# Patient Record
Sex: Male | Born: 1945 | Race: White | Hispanic: No | Smoking: Former smoker
Health system: Southern US, Community
[De-identification: ages and names within clinical notes are randomized; demographics above are authoritative.]

## PROBLEM LIST (undated history)

## (undated) DIAGNOSIS — E785 Hyperlipidemia, unspecified: Secondary | ICD-10-CM

## (undated) DIAGNOSIS — C801 Malignant (primary) neoplasm, unspecified: Secondary | ICD-10-CM

## (undated) DIAGNOSIS — I2510 Atherosclerotic heart disease of native coronary artery without angina pectoris: Secondary | ICD-10-CM

## (undated) DIAGNOSIS — M1990 Unspecified osteoarthritis, unspecified site: Secondary | ICD-10-CM

---

## 2015-08-05 HISTORY — PX: OTHER SURGICAL HISTORY: SHX169

## 2017-03-16 DIAGNOSIS — E7801 Familial hypercholesterolemia: Secondary | ICD-10-CM | POA: Diagnosis not present

## 2017-03-16 DIAGNOSIS — R001 Bradycardia, unspecified: Secondary | ICD-10-CM | POA: Diagnosis not present

## 2017-03-16 DIAGNOSIS — I200 Unstable angina: Secondary | ICD-10-CM | POA: Diagnosis not present

## 2017-03-16 DIAGNOSIS — I208 Other forms of angina pectoris: Secondary | ICD-10-CM | POA: Insufficient documentation

## 2017-03-16 DIAGNOSIS — C9110 Chronic lymphocytic leukemia of B-cell type not having achieved remission: Secondary | ICD-10-CM | POA: Insufficient documentation

## 2017-03-16 DIAGNOSIS — I444 Left anterior fascicular block: Secondary | ICD-10-CM | POA: Diagnosis not present

## 2017-03-16 DIAGNOSIS — I2089 Other forms of angina pectoris: Secondary | ICD-10-CM | POA: Insufficient documentation

## 2017-03-16 DIAGNOSIS — C9190 Lymphoid leukemia, unspecified not having achieved remission: Secondary | ICD-10-CM | POA: Diagnosis not present

## 2017-03-17 DIAGNOSIS — I2510 Atherosclerotic heart disease of native coronary artery without angina pectoris: Secondary | ICD-10-CM | POA: Insufficient documentation

## 2017-03-19 HISTORY — PX: CORONARY ARTERY BYPASS GRAFT: SHX141

## 2017-04-20 ENCOUNTER — Telehealth (HOSPITAL_COMMUNITY): Payer: Self-pay

## 2017-04-20 NOTE — Telephone Encounter (Signed)
I called and left message on voicemail to call office about scheduling for cardiac rehab. I left office contact information on patient voicemail to return call.  ° °

## 2017-04-23 ENCOUNTER — Telehealth (HOSPITAL_COMMUNITY): Payer: Self-pay

## 2017-04-23 NOTE — Telephone Encounter (Signed)
I returned patient phone call to discuss cardiac rehab. Patient is currently out and unable to schedule with me. Patient stated when he gets home, he will call me back to schedule.

## 2017-04-27 ENCOUNTER — Telehealth (HOSPITAL_COMMUNITY): Payer: Self-pay

## 2017-04-27 NOTE — Telephone Encounter (Signed)
Cardiac Rehab Medication Review by a Pharmacist  Does the patient  feel that his/her medications are working for him/her?  yes  Has the patient been experiencing any side effects to the medications prescribed?  no  Does the patient measure his/her own blood pressure or blood glucose at home?  Yes; SBP ranges from 114-142 and DBP 72-88; HR 55-66  Does the patient have any problems obtaining medications due to transportation or finances?   no  Understanding of regimen: good Understanding of indications: good Potential of compliance: good   Pharmacist comments: Marc Stewart is a pleasant 71 y/o M who I spoke with regarding his medication history prior to an upcoming cardiac rehab appointment. He states no concerns with his medications and has been feeling relatively well. He monitors his blood pressure/heart rate very closely and takes the metoprolol only once daily because he feels it is well maintained and might dip to low.   [name], PharmD PGY1 Acute Care Pharmacy Resident 04/27/2017 7:20 PM

## 2017-04-30 ENCOUNTER — Encounter (HOSPITAL_COMMUNITY)
Admission: RE | Admit: 2017-04-30 | Discharge: 2017-04-30 | Disposition: A | Discharge status: Home / Self Care | Payer: No Typology Code available for payment source | Source: Ambulatory Visit | Attending: Cardiology | Admitting: Cardiology

## 2017-04-30 ENCOUNTER — Encounter (HOSPITAL_COMMUNITY): Payer: Self-pay

## 2017-04-30 VITALS — BP 112/62 | HR 58 | Ht 70.25 in | Wt 198.6 lb

## 2017-04-30 DIAGNOSIS — E785 Hyperlipidemia, unspecified: Secondary | ICD-10-CM | POA: Diagnosis not present

## 2017-04-30 DIAGNOSIS — Z951 Presence of aortocoronary bypass graft: Secondary | ICD-10-CM | POA: Diagnosis present

## 2017-04-30 DIAGNOSIS — I2510 Atherosclerotic heart disease of native coronary artery without angina pectoris: Secondary | ICD-10-CM | POA: Diagnosis not present

## 2017-04-30 DIAGNOSIS — Z87891 Personal history of nicotine dependence: Secondary | ICD-10-CM | POA: Insufficient documentation

## 2017-04-30 DIAGNOSIS — C9110 Chronic lymphocytic leukemia of B-cell type not having achieved remission: Secondary | ICD-10-CM | POA: Insufficient documentation

## 2017-04-30 HISTORY — DX: Hyperlipidemia, unspecified: E78.5

## 2017-04-30 HISTORY — DX: Malignant (primary) neoplasm, unspecified: C80.1

## 2017-04-30 HISTORY — DX: Atherosclerotic heart disease of native coronary artery without angina pectoris: I25.10

## 2017-04-30 NOTE — Progress Notes (Signed)
Marc Stewart 71 y.o. male DOB: 12/28/1945 MRN: [id number]      Nutrition Note  1. S/P CABG x 4    Meds reviewed. HT: Ht Readings from Last 3 Encounters:  04/30/17 5' 10.25" (1.784 m)   WT: Wt Readings from Last 3 Encounters:  04/30/17 198 lb 10.2 oz (90.1 kg)   BMI 28.3   Current tobacco use? Unknown at this time Labs:  Lipid Panel  04/03/17 Total Chol 108 Trig 205 HDL 24 LDL 43  03/17/17 Hemoglobin A1c 5.4   CBG (last 3)  No results for input(s): GLUCAP in the last 72 hours.  Nutrition Note Spoke with pt. Nutrition plan and goals reviewed with pt. Pt is following Step 1 of the Therapeutic Lifestyle Changes diet. Pt lives alone and finds that cooking for 1 person is difficult at times. Pt expressed understanding of the information reviewed. Pt aware of nutrition education classes offered .  Nutrition Diagnosis ? Food-and nutrition-related knowledge deficit related to lack of exposure to information as related to diagnosis of: ? CVD  ? Overweight related to excessive energy intake as evidenced by a BMI of 28.3  Nutrition Intervention ? Pt's individual nutrition plan and goals reviewed with pt. ? Pt given handouts for: ? Nutrition I class ? Nutrition II class   Nutrition Goal(s):  ? Pt to identify and limit food sources of saturated fat, trans fat, and sodium  Plan:  Pt to attend nutrition classes ? Nutrition I ? Nutrition II ? Portion Distortion  Will provide client-centered nutrition education as part of interdisciplinary care.   Monitor and evaluate progress toward nutrition goal with team.  Derek Mound, M.Ed, RD, LDN, CDE 04/30/2017 12:23 PM
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
## 2017-05-01 DIAGNOSIS — N400 Enlarged prostate without lower urinary tract symptoms: Secondary | ICD-10-CM | POA: Diagnosis not present

## 2017-05-01 DIAGNOSIS — E784 Other hyperlipidemia: Secondary | ICD-10-CM | POA: Diagnosis not present

## 2017-05-01 DIAGNOSIS — E611 Iron deficiency: Secondary | ICD-10-CM | POA: Diagnosis not present

## 2017-05-01 DIAGNOSIS — F339 Major depressive disorder, recurrent, unspecified: Secondary | ICD-10-CM | POA: Diagnosis not present

## 2017-05-01 DIAGNOSIS — Z131 Encounter for screening for diabetes mellitus: Secondary | ICD-10-CM | POA: Diagnosis not present

## 2017-05-01 DIAGNOSIS — I25811 Atherosclerosis of native coronary artery of transplanted heart without angina pectoris: Secondary | ICD-10-CM | POA: Diagnosis not present

## 2017-05-01 DIAGNOSIS — Z5181 Encounter for therapeutic drug level monitoring: Secondary | ICD-10-CM | POA: Diagnosis not present

## 2017-05-01 DIAGNOSIS — M1711 Unilateral primary osteoarthritis, right knee: Secondary | ICD-10-CM | POA: Diagnosis not present

## 2017-05-01 DIAGNOSIS — L508 Other urticaria: Secondary | ICD-10-CM | POA: Diagnosis not present

## 2017-05-01 DIAGNOSIS — I959 Hypotension, unspecified: Secondary | ICD-10-CM | POA: Diagnosis not present

## 2017-05-06 ENCOUNTER — Encounter (HOSPITAL_COMMUNITY)
Admission: RE | Admit: 2017-05-06 | Discharge: 2017-05-06 | Disposition: A | Discharge status: Home / Self Care | Payer: No Typology Code available for payment source | Source: Ambulatory Visit | Attending: Cardiology | Admitting: Cardiology

## 2017-05-06 DIAGNOSIS — C9110 Chronic lymphocytic leukemia of B-cell type not having achieved remission: Secondary | ICD-10-CM | POA: Insufficient documentation

## 2017-05-06 DIAGNOSIS — Z951 Presence of aortocoronary bypass graft: Secondary | ICD-10-CM | POA: Diagnosis present

## 2017-05-06 DIAGNOSIS — E785 Hyperlipidemia, unspecified: Secondary | ICD-10-CM | POA: Insufficient documentation

## 2017-05-06 DIAGNOSIS — Z87891 Personal history of nicotine dependence: Secondary | ICD-10-CM | POA: Insufficient documentation

## 2017-05-06 DIAGNOSIS — I2510 Atherosclerotic heart disease of native coronary artery without angina pectoris: Secondary | ICD-10-CM | POA: Diagnosis not present

## 2017-05-06 NOTE — Progress Notes (Signed)
Daily Session Note  Patient Details  Name: Marc Stewart MRN: [id number] Date of Birth: 28-Jun-1946 Referring Provider:     CARDIAC REHAB PHASE II ORIENTATION from 04/30/2017 in [place]  Referring Provider  Bayard Males MD [Dr Awanda [facility], Dr Fransico Him ]      Encounter Date: 05/06/2017  Check In:     Session Check In - 05/06/17 1052      Check-In   Location [facility]   Staff Present Barnet Pall, RN, BSN;Amber Fair, MS, ACSM RCEP, Exercise Physiologist;Carlette Wilber Oliphant, RN, Deland Pretty, MS, ACSM CEP, Exercise Physiologist   Supervising physician immediately available to respond to emergencies [facility] immediately available   Physician(s) Dr Eliseo Squires   Medication changes reported     No   Fall or balance concerns reported    No   Tobacco Cessation No Change   Warm-up and Cool-down Performed as group-led instruction   Resistance Training Performed No   VAD Patient? No     Pain Assessment   Currently in Pain? No/denies      Capillary Blood Glucose: No results found for this or any previous visit (from the past 24 hour(s)).    History  Smoking Status  . Former Smoker  . Years: 50.00  . Types: Cigarettes  . Quit date: 1998  Smokeless Tobacco  . Never Used    Comment: patient said he smoked 1pack per week    Goals Met:  Exercise tolerated well  Goals Unmet:  Not Applicable  Comments: [name] started cardiac rehab today.  Pt tolerated light exercise without difficulty. VSS, telemetry-Sinus Rhythm with  An inverted T wave, asymptomatic.  Medication list reconciled. Pt denies barriers to medicaiton compliance.  PSYCHOSOCIAL ASSESSMENT:  PHQ-0. Pt exhibits positive coping skills, hopeful outlook with supportive family. No psychosocial needs identified at this time, no psychosocial interventions necessary.    Pt enjoys gardening, fishing,watching sports and going to the movies.   Pt  oriented to exercise equipment and routine.    Understanding verbalized.Barnet Pall, RN,BSN 05/06/2017 12:34 PM   Dr. Fransico Him is Medical Director for Cardiac Rehab at [facility].

## 2017-05-08 ENCOUNTER — Encounter (HOSPITAL_COMMUNITY)
Admission: RE | Admit: 2017-05-08 | Discharge: 2017-05-08 | Disposition: A | Discharge status: Home / Self Care | Payer: No Typology Code available for payment source | Source: Ambulatory Visit | Attending: Cardiology | Admitting: Cardiology

## 2017-05-08 DIAGNOSIS — Z951 Presence of aortocoronary bypass graft: Secondary | ICD-10-CM

## 2017-05-11 ENCOUNTER — Encounter (HOSPITAL_COMMUNITY)
Admission: RE | Admit: 2017-05-11 | Discharge: 2017-05-11 | Disposition: A | Discharge status: Home / Self Care | Payer: No Typology Code available for payment source | Source: Ambulatory Visit | Attending: Cardiology | Admitting: Cardiology

## 2017-05-11 DIAGNOSIS — Z951 Presence of aortocoronary bypass graft: Secondary | ICD-10-CM | POA: Diagnosis not present

## 2017-05-13 ENCOUNTER — Encounter (HOSPITAL_COMMUNITY)
Admission: RE | Admit: 2017-05-13 | Discharge: 2017-05-13 | Disposition: A | Discharge status: Home / Self Care | Payer: No Typology Code available for payment source | Source: Ambulatory Visit | Attending: Cardiology | Admitting: Cardiology

## 2017-05-13 DIAGNOSIS — Z951 Presence of aortocoronary bypass graft: Secondary | ICD-10-CM
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
## 2017-05-14 DIAGNOSIS — F339 Major depressive disorder, recurrent, unspecified: Secondary | ICD-10-CM | POA: Diagnosis not present

## 2017-05-14 DIAGNOSIS — E611 Iron deficiency: Secondary | ICD-10-CM | POA: Diagnosis not present

## 2017-05-14 DIAGNOSIS — I959 Hypotension, unspecified: Secondary | ICD-10-CM | POA: Diagnosis not present

## 2017-05-14 DIAGNOSIS — M1711 Unilateral primary osteoarthritis, right knee: Secondary | ICD-10-CM | POA: Diagnosis not present

## 2017-05-14 DIAGNOSIS — I25811 Atherosclerosis of native coronary artery of transplanted heart without angina pectoris: Secondary | ICD-10-CM | POA: Diagnosis not present

## 2017-05-14 DIAGNOSIS — N400 Enlarged prostate without lower urinary tract symptoms: Secondary | ICD-10-CM | POA: Diagnosis not present

## 2017-05-14 DIAGNOSIS — E785 Hyperlipidemia, unspecified: Secondary | ICD-10-CM | POA: Diagnosis not present

## 2017-05-15 ENCOUNTER — Encounter (HOSPITAL_COMMUNITY)
Admission: RE | Admit: 2017-05-15 | Discharge: 2017-05-15 | Disposition: A | Discharge status: Home / Self Care | Payer: No Typology Code available for payment source | Source: Ambulatory Visit | Attending: Cardiology | Admitting: Cardiology

## 2017-05-15 DIAGNOSIS — Z951 Presence of aortocoronary bypass graft: Secondary | ICD-10-CM

## 2017-05-18 ENCOUNTER — Encounter (HOSPITAL_COMMUNITY)
Admission: RE | Admit: 2017-05-18 | Discharge: 2017-05-18 | Disposition: A | Discharge status: Home / Self Care | Payer: No Typology Code available for payment source | Source: Ambulatory Visit | Attending: Cardiology | Admitting: Cardiology

## 2017-05-18 DIAGNOSIS — Z951 Presence of aortocoronary bypass graft: Secondary | ICD-10-CM | POA: Diagnosis not present

## 2017-05-20 ENCOUNTER — Encounter (HOSPITAL_COMMUNITY)
Admission: RE | Admit: 2017-05-20 | Discharge: 2017-05-20 | Disposition: A | Discharge status: Home / Self Care | Payer: No Typology Code available for payment source | Source: Ambulatory Visit | Attending: Cardiology | Admitting: Cardiology

## 2017-05-20 DIAGNOSIS — Z951 Presence of aortocoronary bypass graft: Secondary | ICD-10-CM | POA: Diagnosis not present

## 2017-05-22 ENCOUNTER — Encounter (HOSPITAL_COMMUNITY): Payer: No Typology Code available for payment source

## 2017-05-25 ENCOUNTER — Encounter (HOSPITAL_COMMUNITY): Payer: No Typology Code available for payment source

## 2017-05-25 ENCOUNTER — Telehealth (HOSPITAL_COMMUNITY): Payer: Self-pay

## 2017-05-27 ENCOUNTER — Encounter (HOSPITAL_COMMUNITY): Payer: No Typology Code available for payment source

## 2017-05-29 ENCOUNTER — Encounter (HOSPITAL_COMMUNITY): Payer: No Typology Code available for payment source

## 2017-06-01 ENCOUNTER — Encounter (HOSPITAL_COMMUNITY): Payer: No Typology Code available for payment source

## 2017-06-01 ENCOUNTER — Telehealth (HOSPITAL_COMMUNITY): Payer: Self-pay | Admitting: *Deleted

## 2017-06-03 ENCOUNTER — Encounter (HOSPITAL_COMMUNITY)
Admission: RE | Admit: 2017-06-03 | Discharge: 2017-06-03 | Disposition: A | Discharge status: Home / Self Care | Payer: No Typology Code available for payment source | Source: Ambulatory Visit | Attending: Cardiology | Admitting: Cardiology

## 2017-06-03 DIAGNOSIS — Z951 Presence of aortocoronary bypass graft: Secondary | ICD-10-CM | POA: Diagnosis not present

## 2017-06-05 ENCOUNTER — Encounter (HOSPITAL_COMMUNITY): Payer: No Typology Code available for payment source

## 2017-06-05 NOTE — Progress Notes (Signed)
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 and with personalized nutrition plan. Value goals: LDL < 70mg , HDL > 40 mg.      Core Components/Risk Factors/Patient Goals Review:      Goals and Risk Factor Review    Row Name 06/05/17 1428             Core Components/Risk Factors/Patient Goals Review   Personal Goals Review Weight Management/Obesity;Lipids;Hypertension       Review Roczen is doing well with exercise at cardiac rehab       Expected Outcomes Ren will continue to participate  in phase 2 cardiac rehab and take his medicaitons as presribed          Core Components/Risk Factors/Patient Goals at Discharge (Final Review):      Goals and Risk Factor Review - 06/05/17 1428      Core Components/Risk Factors/Patient Goals Review   Personal Goals Review Weight Management/Obesity;Lipids;Hypertension   Review Bodee is doing well with exercise at cardiac rehab   Expected Outcomes Rosa will continue to participate in phase 2 cardiac rehab and take his medicaitons as presribed      ITP Comments:     ITP Comments    Row Name 04/30/17 0827           ITP Comments Dr. Fransico Him, Medical Director          Comments: Damarion is making expected progress toward personal goals after completing 34 sessions. Recommend continued exercise and life style modification education including  stress management and relaxation techniques to decrease cardiac risk profile. Jermain is doing well with exercise at cardiac rehab. Carey has been absent the past two weeks for vacation.Barnet Pall, RN,BSN 06/05/2017 5:00 PM

## 2017-06-08 ENCOUNTER — Encounter (HOSPITAL_COMMUNITY)
Admission: RE | Admit: 2017-06-08 | Discharge: 2017-06-08 | Disposition: A | Discharge status: Home / Self Care | Payer: No Typology Code available for payment source | Source: Ambulatory Visit | Attending: Cardiology | Admitting: Cardiology

## 2017-06-08 DIAGNOSIS — Z87891 Personal history of nicotine dependence: Secondary | ICD-10-CM | POA: Insufficient documentation

## 2017-06-08 DIAGNOSIS — Z951 Presence of aortocoronary bypass graft: Secondary | ICD-10-CM

## 2017-06-08 DIAGNOSIS — C9110 Chronic lymphocytic leukemia of B-cell type not having achieved remission: Secondary | ICD-10-CM | POA: Diagnosis not present

## 2017-06-08 DIAGNOSIS — I2510 Atherosclerotic heart disease of native coronary artery without angina pectoris: Secondary | ICD-10-CM | POA: Diagnosis not present

## 2017-06-08 DIAGNOSIS — E785 Hyperlipidemia, unspecified: Secondary | ICD-10-CM | POA: Insufficient documentation

## 2017-06-08 NOTE — Progress Notes (Signed)
Marc Stewart 71 y.o. male DOB: 01-09-1946 MRN: [id number]      Nutrition Note  1. 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 Nutrition plan and survey reviewed with pt. Pt is following Step 1 of the Therapeutic Lifestyle Changes diet. Per discussion, pt is doing better cooking for himself. Pt has decreased the amount of red meat consumed and is cooking more fish. Pt expressed understanding of the information reviewed. Pt aware of nutrition education classes offered .  Nutrition Diagnosis ? Food-and nutrition-related knowledge deficit related to lack of exposure to information as related to diagnosis of: ? CVD  ? Overweight related to excessive energy intake as evidenced by a BMI of 28.3  Nutrition Intervention ? Pt's individual nutrition plan and goals reviewed with pt. ? Benefits of adopting Therapeutic Lifestyle Changes discussed when Medficts reviewed.    Nutrition Goal(s):  ? Pt to identify and limit food sources of saturated fat, trans fat, and sodium  Plan:  Pt to attend nutrition classes ? Portion Distortion - met 05/20/17 Will provide client-centered nutrition education as part of interdisciplinary care.   Monitor and evaluate progress toward nutrition goal with team.  Derek Mound, M.Ed, RD, LDN, CDE 06/08/2017 11:26 AM

## 2017-06-10 ENCOUNTER — Encounter (HOSPITAL_COMMUNITY)
Admission: RE | Admit: 2017-06-10 | Discharge: 2017-06-10 | Disposition: A | Discharge status: Home / Self Care | Payer: No Typology Code available for payment source | Source: Ambulatory Visit | Attending: Cardiology | Admitting: Cardiology

## 2017-06-10 DIAGNOSIS — Z951 Presence of aortocoronary bypass graft: Secondary | ICD-10-CM | POA: Diagnosis not present

## 2017-06-12 ENCOUNTER — Encounter (HOSPITAL_COMMUNITY)
Admission: RE | Admit: 2017-06-12 | Discharge: 2017-06-12 | Disposition: A | Discharge status: Home / Self Care | Payer: No Typology Code available for payment source | Source: Ambulatory Visit | Attending: Cardiology | Admitting: Cardiology

## 2017-06-12 DIAGNOSIS — Z951 Presence of aortocoronary bypass graft: Secondary | ICD-10-CM | POA: Diagnosis not present

## 2017-06-15 ENCOUNTER — Encounter (HOSPITAL_COMMUNITY)
Admission: RE | Admit: 2017-06-15 | Discharge: 2017-06-15 | Disposition: A | Discharge status: Home / Self Care | Payer: No Typology Code available for payment source | Source: Ambulatory Visit | Attending: Cardiology | Admitting: Cardiology

## 2017-06-15 DIAGNOSIS — Z951 Presence of aortocoronary bypass graft: Secondary | ICD-10-CM

## 2017-06-17 ENCOUNTER — Encounter (HOSPITAL_COMMUNITY): Payer: No Typology Code available for payment source

## 2017-06-19 ENCOUNTER — Encounter (HOSPITAL_COMMUNITY)
Admission: RE | Admit: 2017-06-19 | Discharge: 2017-06-19 | Disposition: A | Discharge status: Home / Self Care | Payer: No Typology Code available for payment source | Source: Ambulatory Visit | Attending: Cardiology | Admitting: Cardiology

## 2017-06-19 DIAGNOSIS — Z951 Presence of aortocoronary bypass graft: Secondary | ICD-10-CM

## 2017-06-22 ENCOUNTER — Encounter (HOSPITAL_COMMUNITY)
Admission: RE | Admit: 2017-06-22 | Discharge: 2017-06-22 | Disposition: A | Discharge status: Home / Self Care | Payer: No Typology Code available for payment source | Source: Ambulatory Visit | Attending: Cardiology | Admitting: Cardiology

## 2017-06-22 DIAGNOSIS — Z951 Presence of aortocoronary bypass graft: Secondary | ICD-10-CM | POA: Diagnosis not present

## 2017-06-24 ENCOUNTER — Encounter (HOSPITAL_COMMUNITY)
Admission: RE | Admit: 2017-06-24 | Discharge: 2017-06-24 | Disposition: A | Discharge status: Home / Self Care | Payer: No Typology Code available for payment source | Source: Ambulatory Visit | Attending: Cardiology | Admitting: Cardiology

## 2017-06-24 DIAGNOSIS — Z951 Presence of aortocoronary bypass graft: Secondary | ICD-10-CM

## 2017-06-29 ENCOUNTER — Telehealth (HOSPITAL_COMMUNITY): Payer: Self-pay | Admitting: *Deleted

## 2017-06-29 ENCOUNTER — Encounter (HOSPITAL_COMMUNITY): Payer: No Typology Code available for payment source

## 2017-07-01 ENCOUNTER — Telehealth (HOSPITAL_COMMUNITY): Payer: Self-pay | Admitting: *Deleted

## 2017-07-01 ENCOUNTER — Encounter (HOSPITAL_COMMUNITY): Payer: No Typology Code available for payment source

## 2017-07-03 ENCOUNTER — Encounter (HOSPITAL_COMMUNITY): Payer: Self-pay | Admitting: *Deleted

## 2017-07-03 ENCOUNTER — Encounter (HOSPITAL_COMMUNITY): Payer: No Typology Code available for payment source

## 2017-07-03 DIAGNOSIS — Z951 Presence of aortocoronary bypass graft: Secondary | ICD-10-CM

## 2017-07-03 NOTE — Progress Notes (Signed)
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  acetaminophen (TYLENOL) 325 MG tablet, Take 325 mg by mouth every 6 (six) hours as needed., Disp: , Rfl:  .  aspirin 325 MG tablet, Take 325 mg by mouth daily., Disp: , Rfl:  .  atorvastatin (LIPITOR) 20 MG tablet, Take 20 mg by mouth daily at 6 PM., Disp: , Rfl:  .  ferrous sulfate 325 (65 FE) MG EC tablet, Take 325 mg by mouth daily with breakfast., Disp: , Rfl:  .  finasteride (PROSCAR) 5 MG tablet, Take 5 mg by mouth daily., Disp: , Rfl:  .  FLUoxetine (PROZAC) 40 MG capsule, Take 40 mg by mouth daily. Takes two in the morning, Disp: , Rfl:  .  metoprolol tartrate (LOPRESSOR) 25 MG tablet, Take 25 mg by mouth 2 (two) times daily., Disp: , Rfl:  .  naproxen (NAPROSYN) 250 MG tablet, Take 250 mg by mouth daily as needed., Disp: , Rfl:  .  nitroGLYCERIN (NITROSTAT) 0.4 MG SL tablet, Place 0.4 mg under the tongue every 5 (five) minutes as needed for chest pain., Disp: , Rfl:  .  senna (SENOKOT) 8.6 MG TABS tablet, Take 1 tablet by mouth daily as needed for mild constipation., Disp: , Rfl:  .  tamsulosin (FLOMAX) 0.4 MG CAPS capsule, Take 0.4 mg by mouth daily., Disp: , Rfl:   Past Medical History: Past Medical History:  Diagnosis Date  . Cancer (HCC)    Chronic  Lymphocytic Leukemia  . Coronary artery disease   . Hyperlipidemia     Tobacco Use: Social History   Tobacco Use  Smoking Status Former Smoker  . Years: 50.00  . Types: Cigarettes  . Last attempt to quit: 1998  . Years since quitting: 20.9  Smokeless Tobacco Never Used  Tobacco Comment   patient said he smoked 1pack per week    Labs: Recent Review Flowsheet Data    There is no flowsheet data to display.      Capillary Blood Glucose: No results found for: GLUCAP   Exercise Target Goals:    Exercise Program Goal: Individual exercise prescription set with THRR, safety & activity barriers. Participant demonstrates ability to understand and report RPE using BORG scale, to self-measure pulse accurately, and to acknowledge the importance of the exercise prescription.  Exercise Prescription Goal: Starting with aerobic activity 30 plus minutes a day, 3 days per week for initial exercise prescription. Provide home exercise prescription and guidelines that participant acknowledges understanding prior to discharge.  Activity Barriers & Risk Stratification:   6 Minute Walk:   Oxygen Initial Assessment:   Oxygen Re-Evaluation:   Oxygen Discharge (Final Oxygen Re-Evaluation):   Initial Exercise Prescription:   Perform Capillary Blood Glucose checks as needed.  Exercise Prescription Changes: Exercise Prescription Changes  Row Name 05/11/17 1500 06/03/17 1100 06/22/17 0949         Response to Exercise   Blood Pressure (Admit)  110/64  104/60  110/70     Blood Pressure (Exercise)  124/70  118/78  128/74     Blood Pressure (Exit)  100/60  122/78  122/82     Heart Rate (Admit)  59 bpm  59 bpm  87 bpm     Heart Rate (Exercise)  81 bpm  87 bpm  102 bpm     Heart Rate (Exit)  56 bpm  58 bpm  87 bpm     Rating of Perceived Exertion (Exercise)  13  13  12      Symptoms  none  none  none     Duration  Progress to 30 minutes of  aerobic without signs/symptoms of  physical distress  Progress to 30 minutes of  aerobic without signs/symptoms of physical distress  Progress to 30 minutes of  aerobic without signs/symptoms of physical distress     Intensity  THRR unchanged  THRR unchanged  THRR unchanged       Progression   Progression  Continue to progress workloads to maintain intensity without signs/symptoms of physical distress.  Continue to progress workloads to maintain intensity without signs/symptoms of physical distress.  Continue to progress workloads to maintain intensity without signs/symptoms of physical distress.     Average METs  2.8  3.9  4.8       Resistance Training   Training Prescription  Yes  No  Yes     Weight  3lbs  -  5lbs     Reps  10-15  -  10-15     Time  10 Minutes  -  10 Minutes       Interval Training   Interval Training  No  No  No       Treadmill   MPH  2.7  2.8  2.8     Grade  1  3  3      Minutes  10  10  10      METs  3.44  3.91  4.3       Bike   Level  4 upright scifit  5 upright scifit  5 upright scifit     Minutes  10  10  10      METs  -  4.1  4       NuStep   Level  2  5  5      SPM  80  90  90     Minutes  10  10  10      METs  2.1  3.6  6.1       Home Exercise Plan   Plans to continue exercise at  -  Home (comment)  Home (comment)     Frequency  -  Add 3 additional days to program exercise sessions.  Add 3 additional days to program exercise sessions.     Initial Home Exercises Provided  -  05/18/17  05/18/17        Exercise Comments: Exercise Comments    Row Name 05/13/17 1106 06/03/17 0947 06/22/17 0949       Exercise Comments  Reviewed METs and goals with patient.  Reviewed METs and goals with patient.  Reviewed goals with patient.        Exercise Goals and Review:   Exercise Goals Re-Evaluation : Exercise Goals Re-Evaluation    Row Name  05/13/17 1106 06/03/17 0947 06/22/17 0949         Exercise Goal Re-Evaluation   Exercise Goals Review  Increase Physical Activity  Increase Physical  Activity  Increase Physical Activity     Comments  Patient is walking 15 minutes, 3 days/week.  Patient is still walking 15 minutes, 3 days/week. Encouraged to gradually  increase duration to 30 minutes, 3 days/week, and pt is agreeable to this.  Making good progress with exercise at cardiac rehab. Pt states he's walking 15 minutes daily at home for his exercise.     Expected Outcomes  Increase exercise duration to 30 minutes, 3 days/week.  Increase exercise duration to 30 minutes, 3 days/week in addition to exercise at CR.  Increase exercise from 15 minutes to 30 minutes in addition to exercise at CR.         Discharge Exercise Prescription (Final Exercise Prescription Changes): Exercise Prescription Changes - 06/22/17 0949      Response to Exercise   Blood Pressure (Admit)  110/70    Blood Pressure (Exercise)  128/74    Blood Pressure (Exit)  122/82    Heart Rate (Admit)  87 bpm    Heart Rate (Exercise)  102 bpm    Heart Rate (Exit)  87 bpm    Rating of Perceived Exertion (Exercise)  12    Symptoms  none    Duration  Progress to 30 minutes of  aerobic without signs/symptoms of physical distress    Intensity  THRR unchanged      Progression   Progression  Continue to progress workloads to maintain intensity without signs/symptoms of physical distress.    Average METs  4.8      Resistance Training   Training Prescription  Yes    Weight  5lbs    Reps  10-15    Time  10 Minutes      Interval Training   Interval Training  No      Treadmill   MPH  2.8    Grade  3    Minutes  10    METs  4.3      Bike   Level  5 upright scifit    Minutes  10    METs  4      NuStep   Level  5    SPM  90    Minutes  10    METs  6.1      Home Exercise Plan   Plans to continue exercise at  Home (comment)    Frequency  Add 3 additional days to program exercise sessions.    Initial Home Exercises Provided  05/18/17       Nutrition:  Target Goals: Understanding of nutrition guidelines,  daily intake of sodium 1500mg , cholesterol 200mg , calories 30% from fat and 7% or less from saturated fats, daily to have 5 or more servings of fruits and vegetables.  Biometrics:    Nutrition Therapy Plan and Nutrition Goals:   Nutrition Discharge: Nutrition Scores:   Nutrition Goals Re-Evaluation:   Nutrition Goals Re-Evaluation:   Nutrition Goals Discharge (Final Nutrition Goals Re-Evaluation):   Psychosocial: Target Goals: Acknowledge presence or absence of significant depression and/or stress, maximize coping skills, provide positive support system. Participant is able to verbalize types and ability to use techniques and skills needed for reducing stress and depression.  Initial Review & Psychosocial Screening:   Quality of Life Scores:   PHQ-9: Recent Review Flowsheet Data    Depression screen  PHQ 2/9 05/06/2017   Decreased Interest 0   Down, Depressed, Hopeless 0   PHQ - 2 Score 0     Interpretation of Total Score  Total Score Depression Severity:  1-4 = Minimal depression, 5-9 = Mild depression, 10-14 = Moderate depression, 15-19 = Moderately severe depression, 20-27 = Severe depression   Psychosocial Evaluation and Intervention:   Psychosocial Re-Evaluation: Psychosocial Re-Evaluation    Row Name 06/05/17 1430 07/03/17 1724           Psychosocial Re-Evaluation   Current issues with  None Identified  None Identified      Interventions  Encouraged to attend Cardiac Rehabilitation for the exercise  Encouraged to attend Cardiac Rehabilitation for the exercise      Continue Psychosocial Services   No Follow up required  No Follow up required         Psychosocial Discharge (Final Psychosocial Re-Evaluation): Psychosocial Re-Evaluation - 07/03/17 1724      Psychosocial Re-Evaluation   Current issues with  None Identified    Interventions  Encouraged to attend Cardiac Rehabilitation for the exercise    Continue Psychosocial Services   No Follow up  required       Vocational Rehabilitation: Provide vocational rehab assistance to qualifying candidates.   Vocational Rehab Evaluation & Intervention:   Education: Education Goals: Education classes will be provided on a weekly basis, covering required topics. Participant will state understanding/return demonstration of topics presented.  Learning Barriers/Preferences:   Education Topics: Count Your Pulse:  -Group instruction provided by verbal instruction, demonstration, patient participation and written materials to support subject.  Instructors address importance of being able to find your pulse and how to count your pulse when at home without a heart monitor.  Patients get hands on experience counting their pulse with staff help and individually.   Heart Attack, Angina, and Risk Factor Modification:  -Group instruction provided by verbal instruction, video, and written materials to support subject.  Instructors address signs and symptoms of angina and heart attacks.    Also discuss risk factors for heart disease and how to make changes to improve heart health risk factors.   Functional Fitness:  -Group instruction provided by verbal instruction, demonstration, patient participation, and written materials to support subject.  Instructors address safety measures for doing things around the house.  Discuss how to get up and down off the floor, how to pick things up properly, how to safely get out of a chair without assistance, and balance training.   Meditation and Mindfulness:  -Group instruction provided by verbal instruction, patient participation, and written materials to support subject.  Instructor addresses importance of mindfulness and meditation practice to help reduce stress and improve awareness.  Instructor also leads participants through a meditation exercise.    Stretching for Flexibility and Mobility:  -Group instruction provided by verbal instruction, patient  participation, and written materials to support subject.  Instructors lead participants through series of stretches that are designed to increase flexibility thus improving mobility.  These stretches are additional exercise for major muscle groups that are typically performed during regular warm up and cool down.   CARDIAC REHAB PHASE II EXERCISE from 06/12/2017 in [place]  Date  05/08/17  Instruction Review Code  2- meets goals/outcomes      Hands Only CPR:  -Group verbal, video, and participation provides a basic overview of AHA guidelines for community CPR. Role-play of emergencies allow participants the opportunity to practice calling for help  and chest compression technique with discussion of AED use.   Hypertension: -Group verbal and written instruction that provides a basic overview of hypertension including the most recent diagnostic guidelines, risk factor reduction with self-care instructions and medication management.    Nutrition I class: Heart Healthy Eating:  -Group instruction provided by PowerPoint slides, verbal discussion, and written materials to support subject matter. The instructor gives an explanation and review of the Therapeutic Lifestyle Changes diet recommendations, which includes a discussion on lipid goals, dietary fat, sodium, fiber, plant stanol/sterol esters, sugar, and the components of a well-balanced, healthy diet.   CARDIAC REHAB PHASE II EXERCISE from 06/12/2017 in [place]  Date  04/30/17  Educator  RD  Instruction Review Code  Not applicable      Nutrition II class: Lifestyle Skills:  -Group instruction provided by PowerPoint slides, verbal discussion, and written materials to support subject matter. The instructor gives an explanation and review of label reading, grocery shopping for heart health, heart healthy recipe modifications, and ways to make healthier choices when eating out.    CARDIAC REHAB PHASE II EXERCISE from 06/12/2017 in [place]  Date  04/30/17  Educator  RD  Instruction Review Code  Not applicable      Diabetes Question & Answer:  -Group instruction provided by PowerPoint slides, verbal discussion, and written materials to support subject matter. The instructor gives an explanation and review of diabetes co-morbidities, pre- and post-prandial blood glucose goals, pre-exercise blood glucose goals, signs, symptoms, and treatment of hypoglycemia and hyperglycemia, and foot care basics.   Diabetes Blitz:  -Group instruction provided by PowerPoint slides, verbal discussion, and written materials to support subject matter. The instructor gives an explanation and review of the physiology behind type 1 and type 2 diabetes, diabetes medications and rational behind using different medications, pre- and post-prandial blood glucose recommendations and Hemoglobin A1c goals, diabetes diet, and exercise including blood glucose guidelines for exercising safely.    Portion Distortion:  -Group instruction provided by PowerPoint slides, verbal discussion, written materials, and food models to support subject matter. The instructor gives an explanation of serving size versus portion size, changes in portions sizes over the last 20 years, and what consists of a serving from each food group.   CARDIAC REHAB PHASE II EXERCISE from 06/12/2017 in [place]  Date  05/20/17  Educator  RD  Instruction Review Code  2- meets goals/outcomes      Stress Management:  -Group instruction provided by verbal instruction, video, and written materials to support subject matter.  Instructors review role of stress in heart disease and how to cope with stress positively.     Exercising on Your Own:  -Group instruction provided by verbal instruction, power point, and written materials to support subject.  Instructors discuss  benefits of exercise, components of exercise, frequency and intensity of exercise, and end points for exercise.  Also discuss use of nitroglycerin and activating EMS.  Review options of places to exercise outside of rehab.  Review guidelines for sex with heart disease.   CARDIAC REHAB PHASE II EXERCISE from 06/12/2017 in [place]  Date  06/10/17  Instruction Review Code  2- meets goals/outcomes      Cardiac Drugs I:  -Group instruction provided by verbal instruction and written materials to support subject.  Instructor reviews cardiac drug classes: antiplatelets, anticoagulants, beta blockers, and statins.  Instructor discusses reasons, side  effects, and lifestyle considerations for each drug class.   Cardiac Drugs II:  -Group instruction provided by verbal instruction and written materials to support subject.  Instructor reviews cardiac drug classes: angiotensin converting enzyme inhibitors (ACE-I), angiotensin II receptor blockers (ARBs), nitrates, and calcium channel blockers.  Instructor discusses reasons, side effects, and lifestyle considerations for each drug class.   Anatomy and Physiology of the Circulatory System:  Group verbal and written instruction and models provide basic cardiac anatomy and physiology, with the coronary electrical and arterial systems. Review of: AMI, Angina, Valve disease, Heart Failure, Peripheral Artery Disease, Cardiac Arrhythmia, Pacemakers, and the ICD.   Other Education:  -Group or individual verbal, written, or video instructions that support the educational goals of the cardiac rehab program.   CARDIAC REHAB PHASE II EXERCISE from 06/12/2017 in [place]  Date  06/12/17 [Holiday Eating Survival Tips]  Educator  RD  Instruction Review Code  2- Demonstrated Understanding      Knowledge Questionnaire Score:   Core Components/Risk Factors/Patient Goals at Admission:   Core  Components/Risk Factors/Patient Goals Review:  Goals and Risk Factor Review    Row Name 06/05/17 1428 07/03/17 1724           Core Components/Risk Factors/Patient Goals Review   Personal Goals Review  Weight Management/Obesity;Lipids;Hypertension  Weight Management/Obesity;Lipids;Hypertension      Review  Marc Stewart is doing well with exercise at cardiac rehab  Marc Stewart is doing well with exercise at cardiac rehab      Expected Outcomes  Marc Stewart will continue to participate in phase 2 cardiac rehab and take his medicaitons as presribed  Marc Stewart will continue to participate in phase 2 cardiac rehab and take his medicaitons as presribed         Core Components/Risk Factors/Patient Goals at Discharge (Final Review):  Goals and Risk Factor Review - 07/03/17 1724      Core Components/Risk Factors/Patient Goals Review   Personal Goals Review  Weight Management/Obesity;Lipids;Hypertension    Review  Marc Stewart is doing well with exercise at cardiac rehab    Expected Outcomes  Marc Stewart will continue to participate in phase 2 cardiac rehab and take his medicaitons as presribed       ITP Comments: ITP Comments    Row Name 07/03/17 1722           ITP Comments  30 Day ITP review, Marc Stewart's attendance has been fair. Marc Stewart has been out this week due to a cold. Marc Stewart does well with exercise when he is able to attend          Comments: See ITP comment.Barnet Pall, RN,BSN 07/03/2017 5:25 PM

## 2017-07-06 ENCOUNTER — Encounter (HOSPITAL_COMMUNITY)
Admission: RE | Admit: 2017-07-06 | Discharge: 2017-07-06 | Disposition: A | Discharge status: Home / Self Care | Payer: Non-veteran care | Source: Ambulatory Visit | Attending: Cardiology | Admitting: Cardiology

## 2017-07-06 DIAGNOSIS — C9110 Chronic lymphocytic leukemia of B-cell type not having achieved remission: Secondary | ICD-10-CM | POA: Diagnosis not present

## 2017-07-06 DIAGNOSIS — E785 Hyperlipidemia, unspecified: Secondary | ICD-10-CM | POA: Diagnosis not present

## 2017-07-06 DIAGNOSIS — Z87891 Personal history of nicotine dependence: Secondary | ICD-10-CM | POA: Insufficient documentation

## 2017-07-06 DIAGNOSIS — I2510 Atherosclerotic heart disease of native coronary artery without angina pectoris: Secondary | ICD-10-CM | POA: Insufficient documentation

## 2017-07-06 DIAGNOSIS — Z951 Presence of aortocoronary bypass graft: Secondary | ICD-10-CM | POA: Diagnosis present

## 2017-07-08 ENCOUNTER — Encounter (HOSPITAL_COMMUNITY)
Admission: RE | Admit: 2017-07-08 | Discharge: 2017-07-08 | Disposition: A | Discharge status: Home / Self Care | Payer: Non-veteran care | Source: Ambulatory Visit | Attending: Cardiology | Admitting: Cardiology

## 2017-07-08 DIAGNOSIS — Z951 Presence of aortocoronary bypass graft: Secondary | ICD-10-CM | POA: Diagnosis not present

## 2017-07-10 ENCOUNTER — Encounter (HOSPITAL_COMMUNITY): Payer: Non-veteran care

## 2017-07-13 ENCOUNTER — Encounter (HOSPITAL_COMMUNITY): Payer: Non-veteran care

## 2017-07-15 ENCOUNTER — Encounter (HOSPITAL_COMMUNITY): Payer: Non-veteran care

## 2017-07-17 ENCOUNTER — Encounter (HOSPITAL_COMMUNITY)
Admission: RE | Admit: 2017-07-17 | Discharge: 2017-07-17 | Disposition: A | Discharge status: Home / Self Care | Payer: Non-veteran care | Source: Ambulatory Visit | Attending: Cardiology | Admitting: Cardiology

## 2017-07-17 DIAGNOSIS — Z951 Presence of aortocoronary bypass graft: Secondary | ICD-10-CM

## 2017-07-20 ENCOUNTER — Encounter (HOSPITAL_COMMUNITY): Payer: Non-veteran care

## 2017-07-22 ENCOUNTER — Encounter (HOSPITAL_COMMUNITY): Payer: Non-veteran care

## 2017-07-24 ENCOUNTER — Encounter (HOSPITAL_COMMUNITY): Payer: Non-veteran care

## 2017-07-27 ENCOUNTER — Encounter (HOSPITAL_COMMUNITY): Payer: Non-veteran care

## 2017-07-29 ENCOUNTER — Encounter (HOSPITAL_COMMUNITY): Payer: Non-veteran care

## 2017-07-30 ENCOUNTER — Encounter (HOSPITAL_COMMUNITY): Payer: Self-pay | Admitting: *Deleted

## 2017-07-30 DIAGNOSIS — Z951 Presence of aortocoronary bypass graft: Secondary | ICD-10-CM

## 2017-07-30 NOTE — Progress Notes (Signed)
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  acetaminophen (TYLENOL) 325 MG tablet, Take 325 mg by mouth every 6 (six) hours as needed., Disp: , Rfl:  .  aspirin 325 MG tablet, Take 325 mg by mouth daily., Disp: , Rfl:  .  atorvastatin (LIPITOR) 20 MG tablet, Take 20 mg by mouth daily at 6 PM., Disp: , Rfl:  .  ferrous sulfate 325 (65 FE) MG EC tablet, Take 325 mg by mouth daily with breakfast., Disp: , Rfl:  .  finasteride (PROSCAR) 5 MG tablet, Take 5 mg by mouth daily., Disp: , Rfl:  .  FLUoxetine (PROZAC) 40 MG capsule, Take 40 mg by mouth daily. Takes two in the morning, Disp: , Rfl:  .  metoprolol tartrate (LOPRESSOR) 25 MG tablet, Take 25 mg by mouth 2 (two) times daily., Disp: , Rfl:  .  naproxen (NAPROSYN) 250 MG tablet, Take 250 mg by mouth daily as needed., Disp: , Rfl:  .  nitroGLYCERIN (NITROSTAT) 0.4 MG SL tablet, Place 0.4 mg under the tongue every 5 (five) minutes as needed for chest pain., Disp: , Rfl:  .  senna (SENOKOT) 8.6 MG TABS tablet, Take 1 tablet by mouth daily as needed for mild constipation., Disp: , Rfl:  .  tamsulosin (FLOMAX) 0.4 MG CAPS capsule, Take 0.4 mg by mouth daily., Disp: , Rfl:   Past Medical History: Past Medical History:  Diagnosis Date  . Cancer (HCC)    Chronic  Lymphocytic Leukemia  . Coronary artery disease   . Hyperlipidemia     Tobacco Use: Social History   Tobacco Use  Smoking Status Former Smoker  . Years: 50.00  . Types: Cigarettes  . Last attempt to quit: 1998  . Years since quitting: 21.0  Smokeless Tobacco Never Used  Tobacco Comment   patient said he smoked 1pack per week    Labs: Recent Review Flowsheet Data    There is no flowsheet data to display.      Capillary Blood Glucose: No results found for: GLUCAP   Exercise Target Goals:    Exercise Program Goal: Individual exercise prescription set with THRR, safety & activity barriers. Participant demonstrates ability to understand and report RPE using BORG scale, to self-measure pulse accurately, and to acknowledge the importance of the exercise prescription.  Exercise Prescription Goal: Starting with aerobic activity 30 plus minutes a day, 3 days per week for initial exercise prescription. Provide home exercise prescription and guidelines that participant acknowledges understanding prior to discharge.  Activity Barriers & Risk Stratification:   6 Minute Walk:   Oxygen Initial Assessment:   Oxygen Re-Evaluation:   Oxygen Discharge (Final Oxygen Re-Evaluation):   Initial Exercise Prescription:   Perform Capillary Blood Glucose checks as needed.  Exercise Prescription Changes:  Exercise Prescription Changes  Row Name 06/03/17 1100 06/22/17 0949 07/17/17 1100         Response to Exercise   Blood Pressure (Admit)  104/60  110/70  118/64     Blood Pressure (Exercise)  118/78  128/74  118/70     Blood Pressure (Exit)  122/78  122/82  104/60     Heart Rate (Admit)  59 bpm  87 bpm  72 bpm     Heart Rate (Exercise)  87 bpm  102 bpm  104 bpm     Heart Rate (Exit)  58 bpm  87 bpm  81 bpm     Rating of Perceived Exertion (Exercise)  13  12  13      Symptoms  none  none  none     Duration  Progress to 30 minutes of  aerobic without signs/symptoms of  physical distress  Progress to 30 minutes of  aerobic without signs/symptoms of physical distress  Continue with 30 min of aerobic exercise without signs/symptoms of physical distress.     Intensity  THRR unchanged  THRR unchanged  THRR unchanged       Progression   Progression  Continue to progress workloads to maintain intensity without signs/symptoms of physical distress.  Continue to progress workloads to maintain intensity without signs/symptoms of physical distress.  Continue to progress workloads to maintain intensity without signs/symptoms of physical distress.     Average METs  3.9  4.8  4.4       Resistance Training   Training Prescription  No  Yes  Yes     Weight  -  5lbs  5lbs     Reps  -  10-15  10-15     Time  -  10 Minutes  10 Minutes       Interval Training   Interval Training  No  No  No       Treadmill   MPH  2.8  2.8  3     Grade  3  3  2      Minutes  10  10  10      METs  3.91  4.3  4.12       Bike   Level  5 upright scifit  5 upright scifit  6 upright scifit     Minutes  10  10  10      METs  4.1  4  4.6       NuStep   Level  5  5  6      SPM  90  90  90     Minutes  10  10  10      METs  3.6  6.1  4.4       Home Exercise Plan   Plans to continue exercise at  Home (comment)  Home (comment)  Home (comment)     Frequency  Add 3 additional days to program exercise sessions.  Add 3 additional days to program exercise sessions.  Add 3 additional days to program exercise sessions.     Initial Home Exercises Provided  05/18/17  05/18/17  05/18/17        Exercise Comments:  Exercise Comments    Row Name 06/03/17 9528 06/22/17 0949 07/17/17 1000 07/31/17 0846     Exercise Comments  Reviewed METs and goals with patient.  Reviewed goals with patient.  Reviewed METs and goals with patient.  Patient has been out since 07/17/17.       Exercise Goals and  Review:   Exercise Goals Re-Evaluation : Exercise Goals Re-Evaluation    Row Name 06/03/17 0947 06/22/17  0949 07/17/17 1000         Exercise Goal Re-Evaluation   Exercise Goals Review  Increase Physical Activity  Increase Physical Activity  Increase Strength and Stamina     Comments  Patient is still walking 15 minutes, 3 days/week. Encouraged to gradually  increase duration to 30 minutes, 3 days/week, and pt is agreeable to this.  Making good progress with exercise at cardiac rehab. Pt states he's walking 15 minutes daily at home for his exercise.  Patient continues to do well with exercise. Increased hand weights from 4lbs to 5lbs to help achieve goal of increased upper body strength.     Expected Outcomes  Increase exercise duration to 30 minutes, 3 days/week in addition to exercise at CR.  Increase exercise from 15 minutes to 30 minutes in addition to exercise at CR.  Patient will increase upper body strength by increasing hand weights as tolerated.         Discharge Exercise Prescription (Final Exercise Prescription Changes): Exercise Prescription Changes - 07/17/17 1100      Response to Exercise   Blood Pressure (Admit)  118/64    Blood Pressure (Exercise)  118/70    Blood Pressure (Exit)  104/60    Heart Rate (Admit)  72 bpm    Heart Rate (Exercise)  104 bpm    Heart Rate (Exit)  81 bpm    Rating of Perceived Exertion (Exercise)  13    Symptoms  none    Duration  Continue with 30 min of aerobic exercise without signs/symptoms of physical distress.    Intensity  THRR unchanged      Progression   Progression  Continue to progress workloads to maintain intensity without signs/symptoms of physical distress.    Average METs  4.4      Resistance Training   Training Prescription  Yes    Weight  5lbs    Reps  10-15    Time  10 Minutes      Interval Training   Interval Training  No      Treadmill   MPH  3    Grade  2    Minutes  10    METs  4.12      Bike   Level  6 upright scifit    Minutes  10    METs  4.6      NuStep   Level  6    SPM  90    Minutes  10    METs   4.4      Home Exercise Plan   Plans to continue exercise at  Home (comment)    Frequency  Add 3 additional days to program exercise sessions.    Initial Home Exercises Provided  05/18/17       Nutrition:  Target Goals: Understanding of nutrition guidelines, daily intake of sodium 1500mg , cholesterol 200mg , calories 30% from fat and 7% or less from saturated fats, daily to have 5 or more servings of fruits and vegetables.  Biometrics:    Nutrition Therapy Plan and Nutrition Goals:   Nutrition Discharge: Nutrition Scores:   Nutrition Goals Re-Evaluation:   Nutrition Goals Re-Evaluation:   Nutrition Goals Discharge (Final Nutrition Goals Re-Evaluation):   Psychosocial: Target Goals: Acknowledge presence or absence of significant depression and/or stress, maximize coping skills, provide positive support system. Participant is able to verbalize types and  ability to use techniques and skills needed for reducing stress and depression.  Initial Review & Psychosocial Screening:   Quality of Life Scores:   PHQ-9: Recent Review Flowsheet Data    Depression screen [facility] 2/9 05/06/2017   Decreased Interest 0   Down, Depressed, Hopeless 0   PHQ - 2 Score 0     Interpretation of Total Score  Total Score Depression Severity:  1-4 = Minimal depression, 5-9 = Mild depression, 10-14 = Moderate depression, 15-19 = Moderately severe depression, 20-27 = Severe depression   Psychosocial Evaluation and Intervention:   Psychosocial Re-Evaluation: Psychosocial Re-Evaluation    Row Name 06/05/17 1430 07/03/17 1724 07/30/17 1230         Psychosocial Re-Evaluation   Current issues with  None Identified  None Identified  None Identified     Interventions  Encouraged to attend Cardiac Rehabilitation for the exercise  Encouraged to attend Cardiac Rehabilitation for the exercise  Encouraged to attend Cardiac Rehabilitation for the exercise     Continue Psychosocial Services   No Follow  up required  No Follow up required  No Follow up required        Psychosocial Discharge (Final Psychosocial Re-Evaluation): Psychosocial Re-Evaluation - 07/30/17 1230      Psychosocial Re-Evaluation   Current issues with  None Identified    Interventions  Encouraged to attend Cardiac Rehabilitation for the exercise    Continue Psychosocial Services   No Follow up required       Vocational Rehabilitation: Provide vocational rehab assistance to qualifying candidates.   Vocational Rehab Evaluation & Intervention:   Education: Education Goals: Education classes will be provided on a weekly basis, covering required topics. Participant will state understanding/return demonstration of topics presented.  Learning Barriers/Preferences:   Education Topics: Count Your Pulse:  -Group instruction provided by verbal instruction, demonstration, patient participation and written materials to support subject.  Instructors address importance of being able to find your pulse and how to count your pulse when at home without a heart monitor.  Patients get hands on experience counting their pulse with staff help and individually.   Heart Attack, Angina, and Risk Factor Modification:  -Group instruction provided by verbal instruction, video, and written materials to support subject.  Instructors address signs and symptoms of angina and heart attacks.    Also discuss risk factors for heart disease and how to make changes to improve heart health risk factors.   Functional Fitness:  -Group instruction provided by verbal instruction, demonstration, patient participation, and written materials to support subject.  Instructors address safety measures for doing things around the house.  Discuss how to get up and down off the floor, how to pick things up properly, how to safely get out of a chair without assistance, and balance training.   Meditation and Mindfulness:  -Group instruction provided by verbal  instruction, patient participation, and written materials to support subject.  Instructor addresses importance of mindfulness and meditation practice to help reduce stress and improve awareness.  Instructor also leads participants through a meditation exercise.    Stretching for Flexibility and Mobility:  -Group instruction provided by verbal instruction, patient participation, and written materials to support subject.  Instructors lead participants through series of stretches that are designed to increase flexibility thus improving mobility.  These stretches are additional exercise for major muscle groups that are typically performed during regular warm up and cool down.   CARDIAC REHAB PHASE II EXERCISE from 06/12/2017 in [facility]  CARDIAC REHAB  Date  05/08/17  Instruction Review Code  2- meets goals/outcomes      Hands Only CPR:  -Group verbal, video, and participation provides a basic overview of AHA guidelines for community CPR. Role-play of emergencies allow participants the opportunity to practice calling for help and chest compression technique with discussion of AED use.   Hypertension: -Group verbal and written instruction that provides a basic overview of hypertension including the most recent diagnostic guidelines, risk factor reduction with self-care instructions and medication management.    Nutrition I class: Heart Healthy Eating:  -Group instruction provided by PowerPoint slides, verbal discussion, and written materials to support subject matter. The instructor gives an explanation and review of the Therapeutic Lifestyle Changes diet recommendations, which includes a discussion on lipid goals, dietary fat, sodium, fiber, plant stanol/sterol esters, sugar, and the components of a well-balanced, healthy diet.   CARDIAC REHAB PHASE II EXERCISE from 06/12/2017 in [place]  Date  04/30/17  Educator  RD  Instruction Review Code   Not applicable      Nutrition II class: Lifestyle Skills:  -Group instruction provided by PowerPoint slides, verbal discussion, and written materials to support subject matter. The instructor gives an explanation and review of label reading, grocery shopping for heart health, heart healthy recipe modifications, and ways to make healthier choices when eating out.   CARDIAC REHAB PHASE II EXERCISE from 06/12/2017 in [place]  Date  04/30/17  Educator  RD  Instruction Review Code  Not applicable      Diabetes Question & Answer:  -Group instruction provided by PowerPoint slides, verbal discussion, and written materials to support subject matter. The instructor gives an explanation and review of diabetes co-morbidities, pre- and post-prandial blood glucose goals, pre-exercise blood glucose goals, signs, symptoms, and treatment of hypoglycemia and hyperglycemia, and foot care basics.   Diabetes Blitz:  -Group instruction provided by PowerPoint slides, verbal discussion, and written materials to support subject matter. The instructor gives an explanation and review of the physiology behind type 1 and type 2 diabetes, diabetes medications and rational behind using different medications, pre- and post-prandial blood glucose recommendations and Hemoglobin A1c goals, diabetes diet, and exercise including blood glucose guidelines for exercising safely.    Portion Distortion:  -Group instruction provided by PowerPoint slides, verbal discussion, written materials, and food models to support subject matter. The instructor gives an explanation of serving size versus portion size, changes in portions sizes over the last 20 years, and what consists of a serving from each food group.   CARDIAC REHAB PHASE II EXERCISE from 06/12/2017 in [place]  Date  05/20/17  Educator  RD  Instruction Review Code  2- meets goals/outcomes      Stress  Management:  -Group instruction provided by verbal instruction, video, and written materials to support subject matter.  Instructors review role of stress in heart disease and how to cope with stress positively.     Exercising on Your Own:  -Group instruction provided by verbal instruction, power point, and written materials to support subject.  Instructors discuss benefits of exercise, components of exercise, frequency and intensity of exercise, and end points for exercise.  Also discuss use of nitroglycerin and activating EMS.  Review options of places to exercise outside of rehab.  Review guidelines for sex with heart disease.   CARDIAC REHAB PHASE II EXERCISE from 06/12/2017 in [place]  Date  06/10/17  Instruction Review Code  2- meets goals/outcomes      Cardiac Drugs I:  -Group instruction provided by verbal instruction and written materials to support subject.  Instructor reviews cardiac drug classes: antiplatelets, anticoagulants, beta blockers, and statins.  Instructor discusses reasons, side effects, and lifestyle considerations for each drug class.   Cardiac Drugs II:  -Group instruction provided by verbal instruction and written materials to support subject.  Instructor reviews cardiac drug classes: angiotensin converting enzyme inhibitors (ACE-I), angiotensin II receptor blockers (ARBs), nitrates, and calcium channel blockers.  Instructor discusses reasons, side effects, and lifestyle considerations for each drug class.   Anatomy and Physiology of the Circulatory System:  Group verbal and written instruction and models provide basic cardiac anatomy and physiology, with the coronary electrical and arterial systems. Review of: AMI, Angina, Valve disease, Heart Failure, Peripheral Artery Disease, Cardiac Arrhythmia, Pacemakers, and the ICD.   Other Education:  -Group or individual verbal, written, or video instructions that support the educational  goals of the cardiac rehab program.   CARDIAC REHAB PHASE II EXERCISE from 06/12/2017 in [place]  Date  06/12/17 [Holiday Eating Survival Tips]  Educator  RD  Instruction Review Code  2- Demonstrated Understanding      Knowledge Questionnaire Score:   Core Components/Risk Factors/Patient Goals at Admission:   Core Components/Risk Factors/Patient Goals Review:  Goals and Risk Factor Review    Row Name 06/05/17 1428 07/03/17 1724 07/30/17 1227         Core Components/Risk Factors/Patient Goals Review   Personal Goals Review  Weight Management/Obesity;Lipids;Hypertension  Weight Management/Obesity;Lipids;Hypertension  Weight Management/Obesity;Lipids;Hypertension     Review  Marti is doing well with exercise at cardiac rehab  Aliou is doing well with exercise at cardiac rehab  Dorion is doing well with exercise at cardiac rehab, Gerell's attendance has been sporadic. Lafayette is currently out of town visiting family     Expected Outcomes  Clance will continue to participate in phase 2 cardiac rehab and take his medicaitons as presribed  Corrado will continue to participate in phase 2 cardiac rehab and take his medicaitons as presribed  Tylar will continue to participate in phase 2 cardiac rehab and take his medicaitons as presribed        Core Components/Risk Factors/Patient Goals at Discharge (Final Review):  Goals and Risk Factor Review - 07/30/17 1227      Core Components/Risk Factors/Patient Goals Review   Personal Goals Review  Weight Management/Obesity;Lipids;Hypertension    Review  Audy is doing well with exercise at cardiac rehab, Aadith's attendance has been sporadic. Davaris is currently out of town visiting family    Expected Outcomes  Divante will continue to participate in phase 2 cardiac rehab and take his medicaitons as presribed       ITP Comments: ITP Comments    Row Name 07/03/17 1722 07/31/17 1351         ITP Comments  30 Day ITP review, Rayshard's  attendance has been fair. Holten has been out this week due to a cold. Adi does well with exercise when he is able to attend  30 Day ITP review, Connie's attendance has been sporadic. Donya has been out visiting family. Reiley's last day of attendance was 07/17/17         Comments: See ITP comment.Barnet Pall, RN,BSN 07/31/2017 1:53 PM

## 2017-07-31 ENCOUNTER — Encounter (HOSPITAL_COMMUNITY): Payer: Non-veteran care

## 2017-08-03 ENCOUNTER — Encounter (HOSPITAL_COMMUNITY): Payer: Non-veteran care

## 2017-08-05 ENCOUNTER — Encounter (HOSPITAL_COMMUNITY): Payer: Non-veteran care

## 2017-08-07 ENCOUNTER — Encounter (HOSPITAL_COMMUNITY)
Admission: RE | Admit: 2017-08-07 | Discharge: 2017-08-07 | Disposition: A | Discharge status: Home / Self Care | Payer: Non-veteran care | Source: Ambulatory Visit | Attending: Cardiology | Admitting: Cardiology

## 2017-08-07 DIAGNOSIS — Z87891 Personal history of nicotine dependence: Secondary | ICD-10-CM | POA: Insufficient documentation

## 2017-08-07 DIAGNOSIS — C9110 Chronic lymphocytic leukemia of B-cell type not having achieved remission: Secondary | ICD-10-CM | POA: Diagnosis not present

## 2017-08-07 DIAGNOSIS — Z951 Presence of aortocoronary bypass graft: Secondary | ICD-10-CM | POA: Diagnosis not present

## 2017-08-07 DIAGNOSIS — I2510 Atherosclerotic heart disease of native coronary artery without angina pectoris: Secondary | ICD-10-CM | POA: Diagnosis not present

## 2017-08-07 DIAGNOSIS — E785 Hyperlipidemia, unspecified: Secondary | ICD-10-CM | POA: Diagnosis not present

## 2017-08-10 ENCOUNTER — Encounter (HOSPITAL_COMMUNITY)
Admission: RE | Admit: 2017-08-10 | Discharge: 2017-08-10 | Disposition: A | Discharge status: Home / Self Care | Payer: Non-veteran care | Source: Ambulatory Visit | Attending: Cardiology | Admitting: Cardiology

## 2017-08-10 DIAGNOSIS — Z951 Presence of aortocoronary bypass graft: Secondary | ICD-10-CM

## 2017-08-12 ENCOUNTER — Encounter (HOSPITAL_COMMUNITY)
Admission: RE | Admit: 2017-08-12 | Discharge: 2017-08-12 | Disposition: A | Discharge status: Home / Self Care | Payer: Non-veteran care | Source: Ambulatory Visit | Attending: Cardiology | Admitting: Cardiology

## 2017-08-12 DIAGNOSIS — Z951 Presence of aortocoronary bypass graft: Secondary | ICD-10-CM

## 2017-08-14 ENCOUNTER — Encounter (HOSPITAL_COMMUNITY)
Admission: RE | Admit: 2017-08-14 | Discharge: 2017-08-14 | Disposition: A | Discharge status: Home / Self Care | Payer: Non-veteran care | Source: Ambulatory Visit | Attending: Cardiology | Admitting: Cardiology

## 2017-08-14 DIAGNOSIS — Z951 Presence of aortocoronary bypass graft: Secondary | ICD-10-CM | POA: Diagnosis not present

## 2017-08-17 ENCOUNTER — Encounter (HOSPITAL_COMMUNITY)
Admission: RE | Admit: 2017-08-17 | Discharge: 2017-08-17 | Disposition: A | Discharge status: Home / Self Care | Payer: Non-veteran care | Source: Ambulatory Visit | Attending: Cardiology | Admitting: Cardiology

## 2017-08-17 DIAGNOSIS — Z951 Presence of aortocoronary bypass graft: Secondary | ICD-10-CM

## 2017-08-19 ENCOUNTER — Encounter (HOSPITAL_COMMUNITY)
Admission: RE | Admit: 2017-08-19 | Discharge: 2017-08-19 | Disposition: A | Discharge status: Home / Self Care | Payer: Non-veteran care | Source: Ambulatory Visit | Attending: Cardiology | Admitting: Cardiology

## 2017-08-19 DIAGNOSIS — Z951 Presence of aortocoronary bypass graft: Secondary | ICD-10-CM | POA: Diagnosis not present

## 2017-08-21 ENCOUNTER — Telehealth (HOSPITAL_COMMUNITY): Payer: Self-pay | Admitting: *Deleted

## 2017-08-24 ENCOUNTER — Encounter (HOSPITAL_COMMUNITY)
Admission: RE | Admit: 2017-08-24 | Discharge: 2017-08-24 | Disposition: A | Discharge status: Home / Self Care | Payer: Non-veteran care | Source: Ambulatory Visit | Attending: Cardiology | Admitting: Cardiology

## 2017-08-24 DIAGNOSIS — Z951 Presence of aortocoronary bypass graft: Secondary | ICD-10-CM

## 2017-08-25 NOTE — Progress Notes (Signed)
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 06/12/2017 in [place]  Date  06/10/17  Instruction Review Code  2- meets goals/outcomes      Cardiac Drugs I:  -Group instruction provided by verbal instruction and written materials to support subject.  Instructor reviews cardiac drug classes: antiplatelets, anticoagulants, beta blockers, and statins.  Instructor discusses reasons, side effects, and lifestyle considerations for each drug class.   Cardiac Drugs II:  -Group instruction provided by verbal instruction and written materials to  support subject.  Instructor  reviews cardiac drug classes: angiotensin converting enzyme inhibitors (ACE-I), angiotensin II receptor blockers (ARBs), nitrates, and calcium channel blockers.  Instructor discusses reasons, side effects, and lifestyle considerations for each drug class.   Anatomy and Physiology of the Circulatory System:  Group verbal and written instruction and models provide basic cardiac anatomy and physiology, with the coronary electrical and arterial systems. Review of: AMI, Angina, Valve disease, Heart Failure, Peripheral Artery Disease, Cardiac Arrhythmia, Pacemakers, and the ICD.   Other Education:  -Group or individual verbal, written, or video instructions that support the educational goals of the cardiac rehab program.   CARDIAC REHAB PHASE II EXERCISE from 06/12/2017 in [place]  Date  06/12/17 [Holiday Eating Survival Tips]  Educator  RD  Instruction Review Code  2- Demonstrated Understanding      Knowledge Questionnaire Score: Knowledge Questionnaire Score - 04/30/17 1244      Knowledge Questionnaire Score   Pre Score  17/24       Core Components/Risk Factors/Patient Goals at Admission: Personal Goals and Risk Factors at Admission - 04/30/17 1622      Core Components/Risk Factors/Patient Goals on Admission    Weight Management  Yes;Weight Maintenance;Weight Loss    Intervention  Weight Management: Develop a combined nutrition and exercise program designed to reach desired caloric intake, while maintaining appropriate intake of nutrient and fiber, sodium and fats, and appropriate energy expenditure required for the weight goal.;Weight Management: Provide education and appropriate resources to help participant work on and attain dietary goals.;Weight Management/Obesity: Establish reasonable short term and long term weight goals.    Admit Weight  198 lb 10.2 oz (90.1 kg)    Goal Weight: Short Term  190 lb (86.2 kg)    Goal  Weight: Long Term  180 lb (81.6 kg)    Expected Outcomes  Long Term: Adherence to nutrition and physical activity/exercise program aimed toward attainment of established weight goal;Short Term: Continue to assess and modify interventions until short term weight is achieved;Weight Maintenance: Understanding of the daily nutrition guidelines, which includes 25-35% calories from fat, 7% or less cal from saturated fats, less than 200mg  cholesterol, less than 1.5gm of sodium, & 5 or more servings of fruits and vegetables daily;Weight Loss: Understanding of general recommendations for a balanced deficit meal plan, which promotes 1-2 lb weight loss per week and includes a negative energy balance of [phone] kcal/d;Understanding recommendations for meals to include 15-35% energy as protein, 25-35% energy from fat, 35-60% energy from carbohydrates, less than 200mg  of dietary cholesterol, 20-35 gm of total fiber daily;Understanding of distribution of calorie intake throughout the day with the consumption of 4-5 meals/snacks    Lipids  Yes    Intervention  Provide education and support for participant on nutrition & aerobic/resistive exercise along with prescribed medications to achieve LDL 70mg , HDL >40mg .    Expected Outcomes  Short Term: Participant states understanding of desired cholesterol values and is compliant with medications prescribed. Participant is following exercise prescription and nutrition guidelines.;Long Term: Cholesterol controlled with medications as prescribed, with individualized exercise RX and with personalized nutrition plan. Value goals: LDL < 70mg , HDL > 40 mg.       Core Components/Risk Factors/Patient Goals Review:  Goals and Risk Factor Review    Row Name 06/05/17 1428 07/03/17 1724 07/30/17 1227 08/25/17 1400       Core Components/Risk Factors/Patient Goals Review   Personal Goals Review  Weight Management/Obesity;Lipids;Hypertension  Weight Management/Obesity;Lipids;Hypertension   Weight Management/Obesity;Lipids;Hypertension  Weight Management/Obesity;Lipids;Hypertension  Review  Marc Stewart is doing well with exercise at cardiac rehab  Marc Stewart is doing well with exercise at cardiac rehab  Marc Stewart is doing well with exercise at cardiac rehab, Marc Stewart's attendance has been sporadic. Marc Stewart is currently out of town visiting family  Marc Stewart is doing well with exercise at cardiac rehab, Marc Stewart's attendance has improved    Expected Outcomes  Marc Stewart will continue to participate in phase 2 cardiac rehab and take his medicaitons as presribed  Marc Stewart will continue to participate in phase 2 cardiac rehab and take his medicaitons as presribed  Marc Stewart will continue to participate in phase 2 cardiac rehab and take his medicaitons as presribed  Marc Stewart will continue to participate in phase 2 cardiac rehab and take his medicaitons as presribed       Core Components/Risk Factors/Patient Goals at Discharge (Final Review):  Goals and Risk Factor Review - 08/25/17 1400      Core Components/Risk Factors/Patient Goals Review   Personal Goals Review  Weight Management/Obesity;Lipids;Hypertension    Review  Marc Stewart is doing well with exercise at cardiac rehab, Marc Stewart's attendance has improved    Expected Outcomes  Marc Stewart will continue to participate in phase 2 cardiac rehab and take his medicaitons as presribed       ITP Comments: ITP Comments    Row Name 04/30/17 0827 07/03/17 1722 07/31/17 1351 08/25/17 1359     ITP Comments  Dr. Fransico Him, Medical Director  30 Day ITP review, Wallis's attendance has been fair. Maggie has been out this week due to a cold. Hal does well with exercise when he is able to attend  30 Day ITP review, Stryder's attendance has been sporadic. Levar has been out visiting family. Dakwon's last day of attendance was 07/17/17  30 Day ITP review. Gene has maintained good attendance and participation since he returned to exercise in January.       Comments: See ITP comments.Barnet Pall, RN,BSN 08/25/2017  2:04 PM

## 2017-08-26 ENCOUNTER — Encounter (HOSPITAL_COMMUNITY)
Admission: RE | Admit: 2017-08-26 | Discharge: 2017-08-26 | Disposition: A | Discharge status: Home / Self Care | Payer: Non-veteran care | Source: Ambulatory Visit | Attending: Cardiology | Admitting: Cardiology

## 2017-08-26 DIAGNOSIS — Z951 Presence of aortocoronary bypass graft: Secondary | ICD-10-CM

## 2017-08-28 ENCOUNTER — Encounter (HOSPITAL_COMMUNITY)
Admission: RE | Admit: 2017-08-28 | Discharge: 2017-08-28 | Disposition: A | Discharge status: Home / Self Care | Payer: Non-veteran care | Source: Ambulatory Visit | Attending: Cardiology | Admitting: Cardiology

## 2017-08-28 DIAGNOSIS — Z951 Presence of aortocoronary bypass graft: Secondary | ICD-10-CM | POA: Diagnosis not present

## 2017-08-31 ENCOUNTER — Encounter (HOSPITAL_COMMUNITY)
Admission: RE | Admit: 2017-08-31 | Discharge: 2017-08-31 | Disposition: A | Discharge status: Home / Self Care | Payer: Non-veteran care | Source: Ambulatory Visit | Attending: Cardiology | Admitting: Cardiology

## 2017-08-31 DIAGNOSIS — Z951 Presence of aortocoronary bypass graft: Secondary | ICD-10-CM

## 2017-09-02 ENCOUNTER — Encounter (HOSPITAL_COMMUNITY)
Admission: RE | Admit: 2017-09-02 | Discharge: 2017-09-02 | Disposition: A | Discharge status: Home / Self Care | Payer: Non-veteran care | Source: Ambulatory Visit | Attending: Cardiology | Admitting: Cardiology

## 2017-09-02 VITALS — BP 110/80 | HR 71 | Ht 70.25 in | Wt 199.5 lb

## 2017-09-02 DIAGNOSIS — Z951 Presence of aortocoronary bypass graft: Secondary | ICD-10-CM

## 2017-09-02 NOTE — Progress Notes (Signed)
Discharge Progress Report  Patient Details  Name: Marc Stewart MRN: [id number] Date of Birth: 1946-06-30 Referring Provider:     CARDIAC REHAB PHASE II ORIENTATION from 04/30/2017 in [place]  Referring Provider  Bayard Males MD [Dr [facility], Dr Fransico Him ]       Number of Visits: 30  Reason for Discharge:  Patient independent in their exercise.  Smoking History:  Social History   Tobacco Use  Smoking Status Former Smoker  . Years: 50.00  . Types: Cigarettes  . Last attempt to quit: 1998  . 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 [facility]  ADL UCSD:   Initial Exercise Prescription: Initial Exercise Prescription - 04/30/17 1200      Date of Initial Exercise RX and Referring Provider   Date  04/30/17    Referring Provider  Bayard Males MD Dr [facility], Dr Fransico Him       Treadmill   MPH  2.2    Grade  0    Minutes  10    METs  2.68      Bike   Level  2 upright scifit    Minutes  10    METs  2      NuStep   Level  2    SPM  80    Minutes  10    METs  2      Prescription Details   Frequency (times per week)  3    Duration  Progress to 30 minutes of continuous aerobic without signs/symptoms of physical distress      Intensity   THRR 40-80% of Max Heartrate  60-119    Ratings of Perceived Exertion  11-13    Perceived Dyspnea  0-4      Progression   Progression  Continue to progress workloads to maintain intensity without signs/symptoms of physical distress.      Resistance Training   Training Prescription  Yes    Weight  3lbs    Reps  10-15       Discharge Exercise Prescription (Final Exercise Prescription Changes): Exercise Prescription Changes - 09/02/17 1100      Response to Exercise   Blood Pressure (Admit)  110/80     Blood Pressure (Exercise)  122/60    Blood Pressure (Exit)  104/60    Heart Rate (Admit)  71 bpm    Heart Rate (Exercise)  105 bpm    Heart Rate (Exit)  80 bpm    Rating of Perceived Exertion (Exercise)  14    Symptoms  none    Duration  Continue with 30 min of aerobic exercise without signs/symptoms of physical distress.    Intensity  THRR unchanged      Progression   Progression  Continue to progress workloads to maintain intensity without signs/symptoms of physical distress.    Average METs  5.5      Resistance Training   Training Prescription  No No weights, relaxation today.      Interval Training   Interval Training  No      Treadmill   MPH  3.3    Grade  4    Minutes  10    METs  5.35      Bike   Level  6    Minutes  10    METs  6.7      NuStep   Level  6    SPM  90    Minutes  10    METs  4.5      Home Exercise Plan   Plans to continue exercise at  Home (comment)    Frequency  Add 3 additional days to program exercise sessions.    Initial Home Exercises Provided  05/18/17       Functional Capacity: 6 Minute Walk    Row Name 04/30/17 1225 08/24/17 1008       6 Minute Walk   Phase  Initial  Discharge    Distance  1331 feet  1686 feet    Distance % Change  -  27 %    Walk Time  6 minutes  6 minutes    # of Rest Breaks  0  0    MPH  2.52  3.19    METS  2.7  3.24    RPE  10  12    VO2 Peak  9.45  11.35    Symptoms  No  No    Resting HR  58 bpm  71 bpm    Resting BP  112/62  118/64    Resting Oxygen Saturation   98 %  -    Exercise Oxygen Saturation  during 6 min walk  99 %  -    Max Ex. HR  75 bpm  87 bpm    Max Ex. BP  138/70  118/72    2 Minute Post BP  120/60  112/60       Psychological, QOL, Others - Outcomes: PHQ 2/9: Depression screen [facility] 2/9 09/02/2017 05/06/2017  Decreased Interest 0 0  Down, Depressed, Hopeless 0 0  PHQ - 2 Score 0 0    Quality of Life: Quality of Life - 09/02/17 0941      Quality of Life Scores    Health/Function Pre  25.82 %    Health/Function Post  24.93 %    Health/Function % Change  -3.45 %    Socioeconomic Pre  28.57 %    Socioeconomic Post  23.93 %    Socioeconomic % Change   -16.24 %    Psych/Spiritual Pre  24 %    Psych/Spiritual Post  27 %    Psych/Spiritual % Change  12.5 %    Family Pre  26.63 %    Family Post  25.13 %    Family % Change  -5.63 %    GLOBAL Pre  26.13 %    GLOBAL Post  25.18 %    GLOBAL % Change  -3.64 %       Personal Goals: Goals established at orientation with interventions provided to work toward goal. Personal Goals and Risk Factors at Admission - 04/30/17 1622      Core Components/Risk Factors/Patient Goals on Admission    Weight Management  Yes;Weight Maintenance;Weight Loss    Intervention  Weight Management: Develop a combined nutrition and exercise program designed to reach desired caloric intake, while maintaining appropriate intake of nutrient and fiber, sodium and fats, and appropriate energy expenditure required for the weight goal.;Weight Management: Provide education and appropriate resources to help participant work on and attain dietary goals.;Weight Management/Obesity: Establish reasonable short term and long term weight goals.    Admit Weight  198 lb 10.2 oz (90.1 kg)    Goal Weight: Short Term  190 lb (86.2 kg)    Goal Weight: Long Term  180 lb (81.6 kg)    Expected Outcomes  Long Term: Adherence to nutrition and physical activity/exercise program aimed toward attainment of established weight goal;Short Term: Continue to assess and modify interventions until short term weight is achieved;Weight Maintenance: Understanding of the daily nutrition guidelines, which includes 25-35% calories from fat, 7% or less cal from saturated fats, less than 262m cholesterol, less than 1.5gm of sodium, & 5 or more servings of fruits and vegetables daily;Weight Loss: Understanding of general recommendations for a balanced deficit meal plan, which  promotes 1-2 lb weight loss per week and includes a negative energy balance of [phone] kcal/d;Understanding recommendations for meals to include 15-35% energy as protein, 25-35% energy from fat, 35-60% energy from carbohydrates, less than 2043mof dietary cholesterol, 20-35 gm of total fiber daily;Understanding of distribution of calorie intake throughout the day with the consumption of 4-5 meals/snacks    Lipids  Yes    Intervention  Provide education and support for participant on nutrition & aerobic/resistive exercise along with prescribed medications to achieve LDL <706mHDL >64m63m  Expected Outcomes  Short Term: Participant states understanding of desired cholesterol values and is compliant with medications prescribed. Participant is following exercise prescription and nutrition guidelines.;Long Term: Cholesterol controlled with medications as prescribed, with individualized exercise RX and with personalized nutrition plan. Value goals: LDL < 70mg72mL > 40 mg.        Personal Goals Discharge: Goals and Risk Factor Review    Row Name 06/05/17 1428 07/30/17 1227 08/25/17 1400         Core Components/Risk Factors/Patient Goals Review   Personal Goals Review  Weight Management/Obesity;Lipids;Hypertension  Weight Management/Obesity;Lipids;Hypertension  Weight Management/Obesity;Lipids;Hypertension     Review  Marc Raykwonoing well with exercise at cardiac rehab  Marc Peregrineoing well with exercise at cardiac rehab, Marc Stewart's attendance has been sporadic. Marc Stewart out of town visiting family  Marc Mathayusoing well with exercise at cardiac rehab, Marc Stewart's attendance has improved     Expected Outcomes  Marc Stewart continue to participate in phase 2 cardiac rehab and take his medicaitons as presribed  Marc Stewart continue to participate in phase 2 cardiac rehab and take his medicaitons as presribed  Marc Stewart continue to participate in phase 2 cardiac rehab and take his medicaitons as presribed         Exercise Goals and Review: Exercise Goals    Row Name 04/30/17 0831             Exercise Goals   Increase Physical Activity  Yes       Intervention  Provide advice, education, support and counseling about physical activity/exercise needs.;Develop an individualized exercise prescription for aerobic and resistive training based on initial evaluation findings, risk stratification, comorbidities and participant's personal goals.       Expected Outcomes  Achievement of increased cardiorespiratory fitness and enhanced flexibility, muscular endurance and strength shown through measurements of functional capacity and personal statement of participant.       Increase Strength and Stamina  Yes increase UE strength       Intervention  Provide advice, education, support and counseling about physical activity/exercise needs.;Develop an individualized exercise prescription for aerobic and resistive training based on initial evaluation findings, risk stratification, comorbidities and participant's personal goals.       Expected Outcomes  Achievement of increased cardiorespiratory fitness and enhanced flexibility, muscular endurance and strength shown through  measurements of functional capacity and personal statement of participant.       Able to understand and use rate of perceived exertion (RPE) scale  Yes       Intervention  Provide education and explanation on how to use RPE scale       Expected Outcomes  Short Term: Able to use RPE daily in rehab to express subjective intensity level;Long Term:  Able to use RPE to guide intensity level when exercising independently       Knowledge and understanding of Target Heart Rate Range (THRR)  Yes       Intervention  Provide education and explanation of THRR including how the numbers were predicted and where they are located for reference       Expected Outcomes  Short Term: Able to state/look up THRR;Long Term: Able to use THRR to govern intensity when  exercising independently;Short Term: Able to use daily as guideline for intensity in rehab       Able to check pulse independently  Yes       Intervention  Provide education and demonstration on how to check pulse in carotid and radial arteries.;Review the importance of being able to check your own pulse for safety during independent exercise       Expected Outcomes  Short Term: Able to explain why pulse checking is important during independent exercise;Long Term: Able to check pulse independently and accurately       Understanding of Exercise Prescription  Yes       Intervention  Provide education, explanation, and written materials on patient's individual exercise prescription       Expected Outcomes  Short Term: Able to explain program exercise prescription;Long Term: Able to explain home exercise prescription to exercise independently          Nutrition & Weight - Outcomes: Pre Biometrics - 04/30/17 1616      Pre Biometrics   Height  5' 10.25" (1.784 m)    Weight  198 lb 10.2 oz (90.1 kg)    Waist Circumference  40.5 inches    Hip Circumference  40 inches    Waist to Hip Ratio  1.01 %    BMI (Calculated)  28.31    Triceps Skinfold  24 mm    % Body Fat  29.8 %    Grip Strength  34 kg    Flexibility  8.5 in    Single Leg Stand  30 seconds      Post Biometrics - 09/02/17 0949       Post  Biometrics   Height  5' 10.25" (1.784 m)    Weight  199 lb 8.3 oz (90.5 kg)    Waist Circumference  41.25 inches    Hip Circumference  41.25 inches    Waist to Hip Ratio  1 %    BMI (Calculated)  28.44    Triceps Skinfold  19 mm    % Body Fat  29.3 %    Grip Strength  38.5 kg    Flexibility  15 in    Single Leg Stand  30 seconds       Nutrition: Nutrition Therapy & Goals - 04/30/17 1222      Nutrition Therapy   Diet  Therapeutic Lifestyle Changes      Intervention Plan   Intervention  Prescribe, educate and counsel regarding individualized specific dietary modifications aiming  towards targeted core components such as weight, hypertension, lipid management, diabetes, heart failure and other comorbidities.  Expected Outcomes  Short Term Goal: Understand basic principles of dietary content, such as calories, fat, sodium, cholesterol and nutrients.;Long Term Goal: Adherence to prescribed nutrition plan.       Nutrition Discharge: Nutrition Assessments - 09/04/17 0944      MEDFICTS Scores   Pre Score  66    Post Score  18    Score Difference  -48       Education Questionnaire Score: Knowledge Questionnaire Score - 09/02/17 0938      Knowledge Questionnaire Score   Pre Score  17/24    Post Score  22/24       Goals reviewed with patient; copy given to patient. Marc Stewart graduated from cardiac rehab program today with completion of 30 exercise sessions in Phase II. Pt maintained fair attendance and progressed nicely during his participation in rehab as evidenced by increased MET level.   Medication list reconciled. Repeat  PHQ score- 0  Pt has made significant lifestyle changes and should be commended for his success. Pt feels he has achieved his goals during cardiac rehab.   Pt plans to continue exercise at [employer] through [employer]. Alaa increased his distance on his post exercise walk test and maintained his current weight. Barnet Pall, RN,BSN 09/10/2017 10:54 AM

## 2017-10-21 DIAGNOSIS — I25811 Atherosclerosis of native coronary artery of transplanted heart without angina pectoris: Secondary | ICD-10-CM | POA: Diagnosis not present

## 2017-10-21 DIAGNOSIS — F339 Major depressive disorder, recurrent, unspecified: Secondary | ICD-10-CM | POA: Diagnosis not present

## 2017-10-21 DIAGNOSIS — Z0000 Encounter for general adult medical examination without abnormal findings: Secondary | ICD-10-CM | POA: Diagnosis not present

## 2017-10-21 DIAGNOSIS — E785 Hyperlipidemia, unspecified: Secondary | ICD-10-CM | POA: Diagnosis not present

## 2017-10-21 DIAGNOSIS — E611 Iron deficiency: Secondary | ICD-10-CM | POA: Diagnosis not present

## 2017-10-21 DIAGNOSIS — M1711 Unilateral primary osteoarthritis, right knee: Secondary | ICD-10-CM | POA: Diagnosis not present

## 2017-10-21 DIAGNOSIS — N400 Enlarged prostate without lower urinary tract symptoms: Secondary | ICD-10-CM | POA: Diagnosis not present

## 2017-10-21 DIAGNOSIS — I959 Hypotension, unspecified: Secondary | ICD-10-CM | POA: Diagnosis not present

## 2020-05-01 DIAGNOSIS — M1711 Unilateral primary osteoarthritis, right knee: Secondary | ICD-10-CM | POA: Diagnosis not present

## 2020-05-01 DIAGNOSIS — Z0001 Encounter for general adult medical examination with abnormal findings: Secondary | ICD-10-CM | POA: Diagnosis not present

## 2020-05-01 DIAGNOSIS — E782 Mixed hyperlipidemia: Secondary | ICD-10-CM | POA: Diagnosis not present

## 2020-05-01 DIAGNOSIS — F339 Major depressive disorder, recurrent, unspecified: Secondary | ICD-10-CM | POA: Diagnosis not present

## 2020-05-01 DIAGNOSIS — Z23 Encounter for immunization: Secondary | ICD-10-CM | POA: Diagnosis not present

## 2020-05-01 DIAGNOSIS — E611 Iron deficiency: Secondary | ICD-10-CM | POA: Diagnosis not present

## 2020-05-01 DIAGNOSIS — I25811 Atherosclerosis of native coronary artery of transplanted heart without angina pectoris: Secondary | ICD-10-CM | POA: Diagnosis not present

## 2020-05-01 DIAGNOSIS — N400 Enlarged prostate without lower urinary tract symptoms: Secondary | ICD-10-CM | POA: Diagnosis not present

## 2020-09-04 DIAGNOSIS — R062 Wheezing: Secondary | ICD-10-CM | POA: Diagnosis not present

## 2020-09-04 DIAGNOSIS — I25811 Atherosclerosis of native coronary artery of transplanted heart without angina pectoris: Secondary | ICD-10-CM | POA: Diagnosis not present

## 2020-09-04 DIAGNOSIS — E611 Iron deficiency: Secondary | ICD-10-CM | POA: Diagnosis not present

## 2020-09-04 DIAGNOSIS — R059 Cough, unspecified: Secondary | ICD-10-CM | POA: Diagnosis not present

## 2020-09-04 DIAGNOSIS — E782 Mixed hyperlipidemia: Secondary | ICD-10-CM | POA: Diagnosis not present

## 2020-09-04 DIAGNOSIS — N400 Enlarged prostate without lower urinary tract symptoms: Secondary | ICD-10-CM | POA: Diagnosis not present

## 2020-09-04 DIAGNOSIS — U071 COVID-19: Secondary | ICD-10-CM | POA: Diagnosis not present

## 2020-09-04 DIAGNOSIS — M1711 Unilateral primary osteoarthritis, right knee: Secondary | ICD-10-CM | POA: Diagnosis not present

## 2020-09-04 DIAGNOSIS — F339 Major depressive disorder, recurrent, unspecified: Secondary | ICD-10-CM | POA: Diagnosis not present

## 2020-10-12 ENCOUNTER — Other Ambulatory Visit: Payer: Self-pay

## 2020-10-12 ENCOUNTER — Emergency Department (HOSPITAL_COMMUNITY): Payer: No Typology Code available for payment source

## 2020-10-12 ENCOUNTER — Encounter (HOSPITAL_COMMUNITY): Payer: Self-pay | Admitting: Emergency Medicine

## 2020-10-12 ENCOUNTER — Emergency Department (HOSPITAL_COMMUNITY)
Admission: EM | Admit: 2020-10-12 | Discharge: 2020-10-12 | Disposition: A | Discharge status: Home / Self Care | Payer: No Typology Code available for payment source | Attending: Emergency Medicine | Admitting: Emergency Medicine

## 2020-10-12 DIAGNOSIS — Z951 Presence of aortocoronary bypass graft: Secondary | ICD-10-CM | POA: Insufficient documentation

## 2020-10-12 DIAGNOSIS — Z20822 Contact with and (suspected) exposure to covid-19: Secondary | ICD-10-CM | POA: Diagnosis not present

## 2020-10-12 DIAGNOSIS — Z87891 Personal history of nicotine dependence: Secondary | ICD-10-CM | POA: Insufficient documentation

## 2020-10-12 DIAGNOSIS — R059 Cough, unspecified: Secondary | ICD-10-CM | POA: Insufficient documentation

## 2020-10-12 DIAGNOSIS — Z96651 Presence of right artificial knee joint: Secondary | ICD-10-CM | POA: Diagnosis not present

## 2020-10-12 DIAGNOSIS — I2510 Atherosclerotic heart disease of native coronary artery without angina pectoris: Secondary | ICD-10-CM | POA: Insufficient documentation

## 2020-10-12 DIAGNOSIS — R220 Localized swelling, mass and lump, head: Secondary | ICD-10-CM | POA: Diagnosis present

## 2020-10-12 DIAGNOSIS — L0201 Cutaneous abscess of face: Secondary | ICD-10-CM | POA: Diagnosis not present

## 2020-10-12 DIAGNOSIS — Z7982 Long term (current) use of aspirin: Secondary | ICD-10-CM | POA: Insufficient documentation

## 2020-10-12 DIAGNOSIS — L0291 Cutaneous abscess, unspecified: Secondary | ICD-10-CM

## 2020-10-12 DIAGNOSIS — C9110 Chronic lymphocytic leukemia of B-cell type not having achieved remission: Secondary | ICD-10-CM | POA: Insufficient documentation

## 2020-10-12 LAB — COMPREHENSIVE METABOLIC PANEL
ALT: 37 U/L (ref 0–44)
AST: 46 U/L — ABNORMAL HIGH (ref 15–41)
Albumin: 3.5 g/dL (ref 3.5–5.0)
Alkaline Phosphatase: 292 U/L — ABNORMAL HIGH (ref 38–126)
Anion gap: 4 — ABNORMAL LOW (ref 5–15)
BUN: 11 mg/dL (ref 8–23)
CO2: 26 mmol/L (ref 22–32)
Calcium: 9 mg/dL (ref 8.9–10.3)
Chloride: 104 mmol/L (ref 98–111)
Creatinine, Ser: 1.06 mg/dL (ref 0.61–1.24)
GFR, Estimated: >60 mL/min (ref >60)
Glucose, Bld: 89 mg/dL (ref 70–99)
Potassium: 4.9 mmol/L (ref 3.5–5.1)
Sodium: 134 mmol/L — ABNORMAL LOW (ref 135–145)
Total Bilirubin: 0.5 mg/dL (ref 0.3–1.2)
Total Protein: 8.6 g/dL — ABNORMAL HIGH (ref 6.5–8.1)

## 2020-10-12 LAB — CBC WITH DIFFERENTIAL/PLATELET
Abs Immature Granulocytes: 0 10*3/uL (ref 0.00–0.07)
Basophils Absolute: 0 10*3/uL (ref 0.0–0.1)
Basophils Relative: 0 %
Eosinophils Absolute: 0 10*3/uL (ref 0.0–0.5)
Eosinophils Relative: 0 %
HCT: 36.3 % — ABNORMAL LOW (ref 39.0–52.0)
Hemoglobin: 10.9 g/dL — ABNORMAL LOW (ref 13.0–17.0)
Lymphocytes Relative: 77 %
Lymphs Abs: 127.7 10*3/uL — ABNORMAL HIGH (ref 0.7–4.0)
MCH: 30.7 pg (ref 26.0–34.0)
MCHC: 30 g/dL (ref 30.0–36.0)
MCV: 102.3 fL — ABNORMAL HIGH (ref 80.0–100.0)
Monocytes Absolute: 28.2 10*3/uL — ABNORMAL HIGH (ref 0.1–1.0)
Monocytes Relative: 17 %
Neutro Abs: 10 10*3/uL — ABNORMAL HIGH (ref 1.7–7.7)
Neutrophils Relative %: 6 %
Platelets: 212 10*3/uL (ref 150–400)
RBC: 3.55 MIL/uL — ABNORMAL LOW (ref 4.22–5.81)
RDW: 14.9 % (ref 11.5–15.5)
WBC: 165.9 10*3/uL (ref 4.0–10.5)
nRBC: 0 % (ref 0.0–0.2)

## 2020-10-12 LAB — RESP PANEL BY RT-PCR (FLU A&B, COVID) ARPGX2
Influenza A by PCR: NEGATIVE
Influenza B by PCR: NEGATIVE
SARS Coronavirus 2 by RT PCR: NEGATIVE

## 2020-10-12 MED ORDER — VANCOMYCIN HCL 1250 MG/250ML IV SOLN
1250.0000 mg | INTRAVENOUS | Status: DC
  Administered 2020-10-12: 1250 mg via INTRAVENOUS
  Filled 2020-10-12: qty 250

## 2020-10-12 MED ORDER — IOHEXOL 300 MG/ML  SOLN: 75.0000 mL | Freq: Once | INTRAMUSCULAR | Status: DC | PRN

## 2020-10-12 MED ORDER — CLINDAMYCIN HCL 150 MG PO CAPS: 300.0000 mg | ORAL_CAPSULE | Freq: Four times a day (QID) | ORAL | 0 refills | Status: AC

## 2020-10-12 MED ORDER — IOHEXOL 300 MG/ML  SOLN
75.0000 mL | Freq: Once | INTRAMUSCULAR | Status: AC | PRN
  Administered 2020-10-12: 75 mL via INTRAVENOUS

## 2020-10-12 MED ORDER — SODIUM CHLORIDE 0.9 % IV SOLN
2.0000 g | Freq: Once | INTRAVENOUS | Status: AC
  Administered 2020-10-12: 2 g via INTRAVENOUS
  Filled 2020-10-12: qty 20

## 2020-10-12 NOTE — Progress Notes (Signed)
Pharmacy Antibiotic Note  Marc Stewart is a 75 y.o. male admitted on 10/12/2020 with cellulitis.  Pharmacy has been consulted for Vancomycin dosing.   Height: 5\' 9"  (175.3 cm) Weight: 92.5 kg (204 lb) IBW/kg (Calculated) : 70.7  Temp (24hrs), Avg:98.4 F (36.9 C), Min:98.4 F (36.9 C), Max:98.4 F (36.9 C)  Recent Labs  Lab 10/12/20 1709  WBC 165.9*  CREATININE 1.06    Estimated Creatinine Clearance: 67.6 mL/min (by C-G formula based on SCr of 1.06 mg/dL).    Allergies  Allergen Reactions  . Amoxicillin Rash    Antimicrobials this admission: 3/11 Ceftriaxone >>  3/11 Vancomycin >>   Dose adjustments this admission: N/a  Microbiology results: Pending   Plan:  - Vancomycin loading dose not indicated for cellulitis - Start Vancomycin 1250mg  IV q24h - Est Calc AUC 497 - Monitor patients renal function and urine output  - De-escalate ABX when appropriate   Thank you for allowing pharmacy to be a part of this patient's care.  Duanne Limerick PharmD. BCPS 10/12/2020 9:01 PM

## 2020-10-12 NOTE — ED Provider Notes (Signed)
[place] EMERGENCY DEPARTMENT Provider Note   CSN: [id number] Arrival date & time: 10/12/20  1519     History Chief Complaint  Patient presents with  . Abscess    Marc Stewart is a 75 y.o. male.  HPI   Patient here for worsening abscess on his left face with new painful lump under his right jaw. He has been taking Keflex since 10/08/20. States he has been applying warm compresses and was able to express serosanguinous diischage.  Denies fever, shortness of breath, chest pain, headache, back pain, nausea, vomiting, diarrhea. Endorses productive cough for the past month. Pt is being seen at the [facility] for CLL (diagnosed in 2017). Denies radiation and immunosuppressants.   Pt is COVID vaccinated and double boosted.       Past Medical History:  Diagnosis Date  . Cancer (HCC)    Chronic Lymphocytic Leukemia  . Coronary artery disease   . Hyperlipidemia     Patient Active Problem List   Diagnosis Date Noted  . CAD (coronary artery disease) 03/17/2017  . 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 CLL (chronic lymphocytic leukemia) ([place]) 03/16/2017  . Angina of effort ([place]) 03/16/2017    Past Surgical History:  Procedure Laterality Date  . CORONARY ARTERY BYPASS GRAFT  03/19/2017   At [facility]  . right knee replacement  2017       No family history on file.  Social History   Tobacco Use  . Smoking status: Former Smoker    Years: 50.00    Types: Cigarettes    Quit date: 1998    Years since quitting: 24.2  . Smokeless tobacco: Never Used  . Tobacco comment: patient said he smoked 1pack per week  Vaping Use  . Vaping Use: Never used    Home Medications Prior to Admission medications   Medication Sig Start Date End Date Taking? Authorizing Provider  clindamycin (CLEOCIN) 150 MG capsule Take 2 capsules (300 mg total) by mouth 4 (four) times daily for 7 days. 10/12/20 10/19/20 Yes Brimage,  Vondra, DO  acetaminophen (TYLENOL) 325 MG tablet Take 325 mg by mouth every 6 (six) hours as needed.    [provider]  aspirin 325 MG tablet Take 325 mg by mouth daily.    [provider]  atorvastatin (LIPITOR) 20 MG tablet Take 20 mg by mouth daily at 6 PM.    [provider]  finasteride (PROSCAR) 5 MG tablet Take 5 mg by mouth daily.    [provider]  FLUoxetine (PROZAC) 40 MG capsule Take 40 mg by mouth daily. Takes two in the morning    [provider]  naproxen (NAPROSYN) 250 MG tablet Take 250 mg by mouth daily as needed.    [provider]  nitroGLYCERIN (NITROSTAT) 0.4 MG SL tablet Place 0.4 mg under the tongue every 5 (five) minutes as needed for chest pain.    [provider]  senna (SENOKOT) 8.6 MG TABS tablet Take 1 tablet by mouth daily as needed for mild constipation.    [provider]  tamsulosin (FLOMAX) 0.4 MG CAPS capsule Take 0.4 mg by mouth daily.    [provider]    Allergies    Amoxicillin  Review of Systems   Review of Systems  Constitutional: Negative for fever.  HENT: Negative for congestion, rhinorrhea and sore throat.   Respiratory: Positive for cough. Negative for shortness of breath.   Cardiovascular: Negative for chest pain.  Gastrointestinal: Negative for diarrhea, nausea and vomiting.  Genitourinary: Negative for dysuria.  Musculoskeletal: Positive for neck pain.  Neurological: Negative for headaches.  All other systems reviewed and are negative.   Physical Exam Updated Vital Signs BP 130/76   Pulse (!) 59   Temp 98.4 F (36.9 C)   Resp 15   Ht 5\' 9"  (1.753 m)   Wt 92.5 kg   SpO2 (!) 84%   BMI 30.13 kg/m   Physical Exam Vitals and nursing note reviewed.  Constitutional:      Appearance: Normal appearance. He is not ill-appearing.  HENT:     Head: Normocephalic and atraumatic.     Nose: Nose normal. No rhinorrhea.  Eyes:     Extraocular Movements:  Extraocular movements intact.     Conjunctiva/sclera: Conjunctivae normal.  Neck:     Comments: Medium-large mass palpable at the angle of the left jaw Cardiovascular:     Rate and Rhythm: Normal rate and regular rhythm.     Pulses: Normal pulses.  Pulmonary:     Effort: Pulmonary effort is normal.     Breath sounds: Normal breath sounds.  Musculoskeletal:     Cervical back: Normal range of motion. Tenderness present.     Right lower leg: No edema.     Left lower leg: No edema.  Lymphadenopathy:     Cervical: Cervical adenopathy present.  Skin:    General: Skin is warm and dry.     Capillary Refill: Capillary refill takes less than 2 seconds.     Comments: 2 cm x 2 cm area erythematous area on the left cheek, with 1 cm central induration with area of purulent material in the center, no fluctuance appreciated  Neurological:     Mental Status: He is alert and oriented to person, place, and time.     Coordination: Coordination normal.  Psychiatric:        Mood and Affect: Mood normal.        Behavior: Behavior normal.     ED Results / Procedures / Treatments   Labs (all labs ordered are listed, but only abnormal results are displayed) Labs Reviewed  CBC WITH DIFFERENTIAL/PLATELET - Abnormal; Notable for the following components:      Result Value   WBC 165.9 (*)    RBC 3.55 (*)    Hemoglobin 10.9 (*)    HCT 36.3 (*)    MCV 102.3 (*)    Neutro Abs 10.0 (*)    Lymphs Abs 127.7 (*)    Monocytes Absolute 28.2 (*)    All other components within normal limits  COMPREHENSIVE METABOLIC PANEL - Abnormal; Notable for the following components:   Sodium 134 (*)    Total Protein 8.6 (*)    AST 46 (*)    Alkaline Phosphatase 292 (*)    Anion gap 4 (*)    All other components within normal limits  RESP PANEL BY RT-PCR (FLU A&B, COVID) [id number]  PATHOLOGIST SMEAR REVIEW    EKG None  Radiology CT Soft Tissue Neck W Contrast  Result Date: 10/12/2020 CLINICAL DATA:  Chronic  lymphocytic leukemia with new onset left facial swelling EXAM: CT NECK WITH CONTRAST TECHNIQUE: Multidetector CT imaging of the neck was performed using the standard protocol following the bolus administration of intravenous contrast. CONTRAST:  76mL OMNIPAQUE IOHEXOL 300 MG/ML  SOLN COMPARISON:  None. FINDINGS: Pharynx and larynx: Normal Salivary glands: Multiple intraglandular lymph nodes of both parotid glands. No sialolithiasis or inflammation. Thyroid: Normal  Lymph nodes: Multiple enlarged submandibular lymph nodes, left-greater-than-right. The largest left level 1B node measures 2.5 x 2.0 cm. The largest right level 1B node measures 2.6 x 1.6 cm. Level 2A nodes measure up to 1.4 cm on the left and 1.0 cm on the right. There are numerous other enlarged lymph nodes throughout the lower and posterior neck. Vascular: Calcific aortic atherosclerosis. Limited intracranial: Normal Visualized orbits: Normal Mastoids and visualized paranasal sinuses: Clear Skeleton: Multilevel spondylosis. Upper chest: Clear lung apices. Other: There is skin thickening of the left malar region of the face. IMPRESSION: 1. Numerous enlarged lymph nodes throughout the lower and posterior neck, left-greater-than-right, most consistent with chronic lymphocytic leukemia. 2. Left malar skin thickening without underlying fluid collection. Aortic Atherosclerosis (ICD10-I70.0). Electronically Signed   By: Ulyses Jarred M.D.   On: 10/12/2020 19:45   DG Chest Port 1 View  Result Date: 10/12/2020 CLINICAL DATA:  Cough EXAM: PORTABLE CHEST 1 VIEW COMPARISON:  None. FINDINGS: Cardiomegaly status post median sternotomy. Both lungs are clear. The visualized skeletal structures are unremarkable. IMPRESSION: Cardiomegaly without acute abnormality of the lungs in AP portable projection. Electronically Signed   By: Eddie Candle M.D.   On: 10/12/2020 18:44    Procedures Procedures   Medications Ordered in ED Medications  iohexol (OMNIPAQUE) 300  MG/ML solution 75 mL ( Intravenous Canceled Entry 10/12/20 1918)  vancomycin (VANCOREADY) IVPB 1250 mg/250 mL (1,250 mg Intravenous New Bag/Given 10/12/20 2141)  iohexol (OMNIPAQUE) 300 MG/ML solution 75 mL (75 mLs Intravenous Contrast Given 10/12/20 1930)  cefTRIAXone (ROCEPHIN) 2 g in sodium chloride 0.9 % 100 mL IVPB (0 g Intravenous Stopped 10/12/20 2141)    ED Course  I have reviewed the triage vital signs and the nursing notes.  Pertinent labs & imaging results that were available during my care of the patient were reviewed by me and considered in my medical decision making (see chart for details).  8:21 PM Consulted with on call Oncologist, Dr. Lindi Adie who recommended pt have a round of IV antibiotics and follow up outpatient with his [place].     MDM Rules/Calculators/A&P                          Pt is a 75 yo male with CAD s/p CABG, CLL and HLD who presented for new neck mass and worsening superficial facial abscess. Pt with cough for past month. CXR obtained without acute process. COVID negative. CT Soft Tissue obtained showed multiple enlarged lymph nodes throughout the lower and posterior neck consistent with CLL.  Discussed with Dr. Lindi Adie, Oncologist on call regarding pt's significant leukocytosis with left shift of cell lines. WBC 165.9. Patient does not know his baseline WBC. He is grossly asymptomatic. Per oncology recommendation, given Vanc and IV CTX. Patient to discharge with Clindamycin. Follow up with oncologist next week. Pt agrees with plan.    Final Clinical Impression(s) / ED Diagnoses Final diagnoses:  CLL (chronic lymphocytic leukemia) ([place])  Abscess    Rx / DC Orders ED Discharge Orders         Ordered    clindamycin (CLEOCIN) 150 MG capsule  4 times daily        10/12/20 2139           Lyndee Hensen, DO 10/12/20 2302    Drenda Freeze, MD 10/12/20 2308

## 2020-10-12 NOTE — ED Triage Notes (Signed)
Patient recently diagnosed with CLL and noticed a red swollen area on left side of face. Went to PCP earlier in the week, was prescribed keflex. Since then edematous area has gotten larger. Patient alert, oriented, and in no apparent distress at this time.

## 2020-10-12 NOTE — Discharge Instructions (Addendum)
Be sure to call your oncologist to get an earlier appointment next week. You had an elevated white blood cell count of 165.8. Stop by the pharmacy to pick up your prescriptions. Stop taking Cephalexin (your other antibiotic).    Take Care,   Dr. Rushie [facility]

## 2020-10-12 NOTE — ED Notes (Signed)
Discharge instructions discussed with pt. Pt verbalized understanding. Pt stable and ambulatory. No signature pad available. 

## 2020-10-15 LAB — PATHOLOGIST SMEAR REVIEW

## 2022-01-15 ENCOUNTER — Encounter (HOSPITAL_COMMUNITY): Payer: Self-pay | Admitting: Pharmacy Technician

## 2022-01-15 ENCOUNTER — Other Ambulatory Visit: Payer: Self-pay

## 2022-01-15 ENCOUNTER — Emergency Department (HOSPITAL_COMMUNITY)
Admission: EM | Admit: 2022-01-15 | Discharge: 2022-01-16 | Disposition: A | Discharge status: Home / Self Care | Payer: No Typology Code available for payment source | Attending: Emergency Medicine | Admitting: Emergency Medicine

## 2022-01-15 DIAGNOSIS — Z856 Personal history of leukemia: Secondary | ICD-10-CM | POA: Diagnosis not present

## 2022-01-15 DIAGNOSIS — R7989 Other specified abnormal findings of blood chemistry: Secondary | ICD-10-CM | POA: Insufficient documentation

## 2022-01-15 DIAGNOSIS — M62838 Other muscle spasm: Secondary | ICD-10-CM | POA: Diagnosis not present

## 2022-01-15 DIAGNOSIS — D72829 Elevated white blood cell count, unspecified: Secondary | ICD-10-CM | POA: Insufficient documentation

## 2022-01-15 DIAGNOSIS — M542 Cervicalgia: Secondary | ICD-10-CM | POA: Diagnosis present

## 2022-01-15 DIAGNOSIS — M436 Torticollis: Secondary | ICD-10-CM | POA: Diagnosis not present

## 2022-01-15 DIAGNOSIS — R739 Hyperglycemia, unspecified: Secondary | ICD-10-CM | POA: Diagnosis not present

## 2022-01-15 DIAGNOSIS — E871 Hypo-osmolality and hyponatremia: Secondary | ICD-10-CM | POA: Insufficient documentation

## 2022-01-15 LAB — BASIC METABOLIC PANEL
Anion gap: 9 (ref 5–15)
BUN: 17 mg/dL (ref 8–23)
CO2: 23 mmol/L (ref 22–32)
Calcium: 8.5 mg/dL — ABNORMAL LOW (ref 8.9–10.3)
Chloride: 102 mmol/L (ref 98–111)
Creatinine, Ser: 1.34 mg/dL — ABNORMAL HIGH (ref 0.61–1.24)
GFR, Estimated: 55 mL/min — ABNORMAL LOW (ref >60)
Glucose, Bld: 138 mg/dL — ABNORMAL HIGH (ref 70–99)
Potassium: 3.7 mmol/L (ref 3.5–5.1)
Sodium: 134 mmol/L — ABNORMAL LOW (ref 135–145)

## 2022-01-15 LAB — CBC WITH DIFFERENTIAL/PLATELET
Abs Immature Granulocytes: 0.1 10*3/uL — ABNORMAL HIGH (ref 0.00–0.07)
Basophils Absolute: 0.1 10*3/uL (ref 0.0–0.1)
Basophils Relative: 0 %
Eosinophils Absolute: 0.1 10*3/uL (ref 0.0–0.5)
Eosinophils Relative: 1 %
HCT: 37 % — ABNORMAL LOW (ref 39.0–52.0)
Hemoglobin: 11.7 g/dL — ABNORMAL LOW (ref 13.0–17.0)
Immature Granulocytes: 1 %
Lymphocytes Relative: 38 %
Lymphs Abs: 7.1 10*3/uL — ABNORMAL HIGH (ref 0.7–4.0)
MCH: 30 pg (ref 26.0–34.0)
MCHC: 31.6 g/dL (ref 30.0–36.0)
MCV: 94.9 fL (ref 80.0–100.0)
Monocytes Absolute: 1 10*3/uL (ref 0.1–1.0)
Monocytes Relative: 6 %
Neutro Abs: 10 10*3/uL — ABNORMAL HIGH (ref 1.7–7.7)
Neutrophils Relative %: 54 %
Platelets: 177 10*3/uL (ref 150–400)
RBC: 3.9 MIL/uL — ABNORMAL LOW (ref 4.22–5.81)
RDW: 13.8 % (ref 11.5–15.5)
WBC: 18.4 10*3/uL — ABNORMAL HIGH (ref 4.0–10.5)
nRBC: 0 % (ref 0.0–0.2)

## 2022-01-15 NOTE — ED Triage Notes (Signed)
Pt with sudden onset neck pain yesterday while he was watching tv. Pain starts at the base of his skull and shoots upwards. Has been taking zanaflex and tylenol with minimal relief.

## 2022-01-15 NOTE — ED Provider Triage Note (Signed)
Emergency Medicine Provider Triage Evaluation Note  Marc Stewart , a 76 y.o. male  was evaluated in triage.  Pt complains of neck pain onset last night.  Denies any trauma.  Denies any vision change, or other complaints.  Has full range of motion in bilateral upper and lower extremities.  Strength is intact.  Took some Tylenol, and Zanaflex prior to arrival with some relief.  He is worried that this is not muscle related.  Review of Systems  Positive: As above Negative: As above  Physical Exam  BP (!) 107/57 (BP Location: Right Arm)   Pulse (!) 59   Temp 97.9 F (36.6 C) (Oral)   Resp 15   SpO2 98%  Gen:   Awake, no distress   Resp:  Normal effort  MSK:   Moves extremities without difficulty, strength intact in bilateral upper and lower extremities.  Cervical spine, thoracic spine, lumbar spine without tenderness to palpation. Other:    Medical Decision Making  Medically screening exam initiated at 5:05 PM.  Appropriate orders placed.  Taryll Reichenberger Arps was informed that the remainder of the evaluation will be completed by another provider, this initial triage assessment does not replace that evaluation, and the importance of remaining in the ED until their evaluation is complete.     Evlyn Courier, PA-C 01/15/22 1706

## 2022-01-16 ENCOUNTER — Encounter (HOSPITAL_COMMUNITY): Payer: Self-pay | Admitting: Emergency Medicine

## 2022-01-16 ENCOUNTER — Emergency Department (HOSPITAL_COMMUNITY): Payer: No Typology Code available for payment source

## 2022-01-16 LAB — PATHOLOGIST SMEAR REVIEW

## 2022-01-16 MED ORDER — KETOROLAC TROMETHAMINE 30 MG/ML IJ SOLN
15.0000 mg | Freq: Once | INTRAMUSCULAR | Status: AC
  Administered 2022-01-16: 15 mg via INTRAVENOUS
  Filled 2022-01-16: qty 1

## 2022-01-16 MED ORDER — GADOBUTROL 1 MMOL/ML IV SOLN
9.0000 mL | Freq: Once | INTRAVENOUS | Status: AC | PRN
  Administered 2022-01-16: 9 mL via INTRAVENOUS

## 2022-01-16 MED ORDER — DIAZEPAM 5 MG/ML IJ SOLN
2.5000 mg | Freq: Once | INTRAMUSCULAR | Status: AC
  Administered 2022-01-16: 2.5 mg via INTRAVENOUS
  Filled 2022-01-16: qty 2

## 2022-01-16 MED ORDER — LACTATED RINGERS IV BOLUS
1000.0000 mL | Freq: Once | INTRAVENOUS | Status: AC
  Administered 2022-01-16: 1000 mL via INTRAVENOUS

## 2022-01-16 NOTE — ED Notes (Signed)
Patient transported to CT 

## 2022-01-16 NOTE — Discharge Instructions (Signed)
Please continue your home medications including your tizanidine Call your doctor and have your labs rechecked including your sodium and creatinine within the next several days Drink plenty of fluids Return if you are having worsening or new symptoms

## 2022-01-16 NOTE — ED Provider Notes (Signed)
[facility] EMERGENCY DEPARTMENT Provider Note   CSN: [id number] Arrival date & time: 01/15/22  1607     History  Chief Complaint  Patient presents with   Neck Pain    Marc Stewart is a 76 y.o. male.  HPI  76 year old male history of chronic lymphocytic leukemia, treated at the [facility], presents today complaining of neck pain that began 2 days ago in the evening.  It is worsened during the day.  Patient took skeletal muscle relaxant and acetaminophen yesterday without relief.  He has not had any trauma.  Denies being on any blood thinners.  He has not had known fever or chills.  Pain is worsened with trying to move his head to the right or to the left.  He states that it began during the evening and he thought might have been the way he slept.  However it continued throughout the day and worsened last night.  He called the [facility] was advised to come to the ED for evaluation.  He denies any vision changes, weakness, numbness, tingling, difficulty speaking.  Home Medications Prior to Admission medications   Medication Sig Start Date End Date Taking? Authorizing Provider  acetaminophen (TYLENOL) 325 MG tablet Take 325 mg by mouth every 6 (six) hours as needed.   Yes [provider]  atorvastatin (LIPITOR) 20 MG tablet Take 20 mg by mouth daily at 6 PM.   Yes [provider]  finasteride (PROSCAR) 5 MG tablet Take 5 mg by mouth daily.   Yes [provider]  FLUoxetine (PROZAC) 40 MG capsule Take 40 mg by mouth daily. Takes two in the morning   Yes [provider]  ibrutinib (IMBRUVICA) 420 MG tablet Take 420 mg by mouth daily. Take with a glass of water.   Yes [provider]  nitroGLYCERIN (NITROSTAT) 0.4 MG SL tablet Place 0.4 mg under the tongue every 5 (five) minutes as needed for chest pain.   Yes [provider]  senna (SENOKOT) 8.6 MG TABS tablet Take 1 tablet by mouth daily as needed for mild  constipation.   Yes [provider]  tamsulosin (FLOMAX) 0.4 MG CAPS capsule Take 0.4 mg by mouth daily.   Yes [provider]  tiZANidine (ZANAFLEX) 4 MG tablet Take 4 mg by mouth every 8 (eight) hours as needed for muscle spasms. 12/03/21  Yes [provider]      Allergies    Amoxicillin    Review of Systems   Review of Systems  Physical Exam Updated Vital Signs BP (!) 141/69   Pulse 63   Temp 98.4 F (36.9 C) (Oral)   Resp 17   SpO2 93%  Physical Exam Vitals and nursing note reviewed.  Constitutional:      General: He is not in acute distress.    Appearance: Normal appearance. He is normal weight.  HENT:     Head: Normocephalic and atraumatic.     Right Ear: Tympanic membrane and external ear normal.     Left Ear: Tympanic membrane and external ear normal.     Nose: Nose normal.     Mouth/Throat:     Mouth: Mucous membranes are moist.     Pharynx: Oropharynx is clear.  Eyes:     Extraocular Movements: Extraocular movements intact.     Conjunctiva/sclera: Conjunctivae normal.     Pupils: Pupils are equal, round, and reactive to light.  Neck:     Comments: Patient holds neck  slightly flexed No external signs of trauma are noted on the posterior anterior exam of the neck There is no tenderness or masses to palpation on the posterior side of the neck There are some some enlarged lymph nodes noted more prominently in the left submandibular region Attempts to move the patient's head with flexion to right left or rotation cause severe pain limiting exam  Cardiovascular:     Rate and Rhythm: Normal rate and regular rhythm.     Pulses: Normal pulses.  Pulmonary:     Effort: Pulmonary effort is normal.     Breath sounds: Normal breath sounds.  Abdominal:     General: Abdomen is flat. Bowel sounds are normal.  Musculoskeletal:        General: Normal range of motion.  Skin:    General: Skin is warm and dry.     Capillary Refill: Capillary refill  takes less than 2 seconds.  Neurological:     General: No focal deficit present.     Mental Status: He is alert.     Cranial Nerves: No cranial nerve deficit.     Sensory: No sensory deficit.     Motor: No weakness.     Coordination: Coordination normal.     Gait: Gait normal.     Deep Tendon Reflexes: Reflexes normal.  Psychiatric:        Mood and Affect: Mood normal.     ED Results / Procedures / Treatments   Labs (all labs ordered are listed, but only abnormal results are displayed) Labs Reviewed  CBC WITH DIFFERENTIAL/PLATELET - Abnormal; Notable for the following components:      Result Value   WBC 18.4 (*)    RBC 3.90 (*)    Hemoglobin 11.7 (*)    HCT 37.0 (*)    Neutro Abs 10.0 (*)    Lymphs Abs 7.1 (*)    Abs Immature Granulocytes 0.10 (*)    All other components within normal limits  BASIC METABOLIC PANEL - Abnormal; Notable for the following components:   Sodium 134 (*)    Glucose, Bld 138 (*)    Creatinine, Ser 1.34 (*)    Calcium 8.5 (*)    GFR, Estimated 55 (*)    All other components within normal limits  PATHOLOGIST SMEAR REVIEW    EKG None  Radiology MR Brain W and Wo Contrast  Addendum Date: 01/16/2022   ADDENDUM REPORT: 01/16/2022 11:56 ADDENDUM: No enhancement associated with the right parietal vertex convexity calcification. This is not significant. Electronically Signed   By: Nelson Chimes M.D.   On: 01/16/2022 11:56   Result Date: 01/16/2022 CLINICAL DATA:  Sudden onset of headache and neck pain yesterday EXAM: MRI HEAD WITHOUT AND WITH CONTRAST TECHNIQUE: Multiplanar, multiecho pulse sequences of the brain and surrounding structures were obtained without and with intravenous contrast. CONTRAST:  18m GADAVIST GADOBUTROL 1 MMOL/ML IV SOLN COMPARISON:  CT studies earlier same day FINDINGS: Brain: No acute or subacute infarction. Small foci of T2 shine through in the left parietal cortical and subcortical brain. 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 No focal cerebellar finding. Cerebral hemispheres show chronic small-vessel ischemic changes of the deep and subcortical white matter and an old infarction at the left parietal cortical and subcortical brain. No mass lesion, hemorrhage, hydrocephalus or extra-axial collection. After contrast administration, no abnormal enhancement occurs. Vascular: Major vessels at the base of the brain show flow.  Skull and upper cervical spine: Negative Sinuses/Orbits: Clear/normal Other: None IMPRESSION: No abnormality seen to explain the clinical presentation. Chronic small-vessel ischemic changes of the cerebral hemispheric white matter. 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 Incidental subcentimeter cavernoma in the right pons without evidence recent hemorrhage or surrounding edema. Electronically Signed: By: Nelson Chimes M.D. On: 01/16/2022 11:28   MR Cervical Spine W or Wo Contrast  Result Date: 01/16/2022 CLINICAL DATA:  Acute onset of headache and neck pain beginning yesterday. EXAM: MRI CERVICAL SPINE WITHOUT AND WITH CONTRAST TECHNIQUE: Multiplanar and multiecho pulse sequences of the cervical spine, to include the craniocervical junction and cervicothoracic junction, were obtained without and with intravenous contrast. CONTRAST:  44m GADAVIST GADOBUTROL 1 MMOL/ML IV SOLN COMPARISON:  CT 01/16/2022 FINDINGS: Alignment: Straightening and slight kyphotic curvature of the cervical spine. Vertebrae: No fracture or focal bone lesion. Chronic discogenic endplate changes without active edema. No edematous facet arthropathy. Cord: No cord compression or focal cord lesion. Posterior Fossa, vertebral arteries, paraspinal tissues: No acute soft tissue finding in the neck. Disc levels: The foramen magnum is widely patent. There is ordinary mild osteoarthritis of the C1-2 articulation but no encroachment upon the neural  structures. C2-3: Left-sided uncovertebral prominence. Chronic left-sided facet osteoarthritis. Left foraminal narrowing that could cause neural compression. C3-4: Bulging of the disc and bilateral uncovertebral hypertrophy. Bilateral facet hypertrophy. Bilateral foraminal narrowing that could cause neural compression. C4-5: Endplate osteophytes and bulging of the disc. Narrowing of the ventral subarachnoid space but no compression of the cord. AP diameter of the canal in the midline 7.7 mm. Bilateral bony foraminal narrowing. C5-6: Spondylosis with endplate osteophytes and bulging of the disc. Narrowing of the subarachnoid space but no compression of the cord. AP diameter of the canal in the midline 7.5 mm. Bilateral foraminal stenosis. C6-7: Endplate osteophytes and bulging of the disc. No compressive canal stenosis. AP diameter of the canal in the midline 9.2 cm. Bilateral foraminal stenosis. C7-T1: Minimal disc bulge. Minimal facet degeneration. No compressive canal or foraminal narrowing. IMPRESSION: No acute finding in the region. No evidence of bone marrow edema or enhancement or facet joint edema or enhancement. 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 These findings could certainly relate to regional pain. There is potential for neural compression on the left at C2-3 and bilateral from C3-4 through C6-7. Electronically Signed   By: MNelson ChimesM.D.   On: 01/16/2022 11:32   CT Head Wo Contrast  Result Date: 01/16/2022 CLINICAL DATA:  Headache, new or worsening (Age >= 50y); Neck pain, acute, known malignancy EXAM: CT HEAD WITHOUT CONTRAST CT CERVICAL SPINE WITHOUT CONTRAST TECHNIQUE: Multidetector CT imaging of the head and cervical spine was performed following the standard protocol without intravenous contrast. Multiplanar CT image reconstructions of the cervical spine were also generated. RADIATION DOSE REDUCTION: This exam was performed  according to the departmental dose-optimization program which includes automated exposure control, adjustment of the mA and/or kV according to patient size and/or use of iterative reconstruction technique. COMPARISON:  None Available. FINDINGS: CT HEAD FINDINGS Brain: No evidence of acute infarction, hemorrhage, hydrocephalus, or extra-axial fluid collection. 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 Mild for age cerebral atrophy. Vascular: Calcific intracranial atherosclerosis. Skull: No acute fracture. Sinuses/Orbits: Minimal paranasal sinus mucosal thickening. No acute orbital findings. Other: No acute orbital findings. CT CERVICAL SPINE FINDINGS Alignment: Reversal  of the normal cervical lordosis. No substantial sagittal subluxation. Skull base and vertebrae: Vertebral body heights are maintained. No evidence of acute fracture. Soft tissues and spinal canal: No prevertebral fluid or swelling. No visible canal hematoma. Disc levels: Severe degenerative disc disease at C4-C5, C5-C6 and C6-C7 including marked disc height loss, sclerotic vertebral body changes and endplate spurring. Severe multilevel facet uncovertebral hypertrophy with varying degrees of neural foraminal stenosis. Upper chest: Visualized lung apices are clear. IMPRESSION: CT head: 1. No evidence of acute intracranial abnormality. 2. 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 CT cervical spine: 1. No evidence of acute fracture or traumatic malalignment. 2. Severe multilevel degenerative change in the cervical spine, described above. Electronically Signed   By: Margaretha Sheffield M.D.   On: 01/16/2022 09:00   CT Cervical Spine Wo Contrast  Result Date: 01/16/2022 CLINICAL DATA:  Headache, new or worsening (Age >= 50y); Neck pain,  acute, known malignancy EXAM: CT HEAD WITHOUT CONTRAST CT CERVICAL SPINE WITHOUT CONTRAST TECHNIQUE: Multidetector CT imaging of the head and cervical spine was performed following the standard protocol without intravenous contrast. Multiplanar CT image reconstructions of the cervical spine were also generated. RADIATION DOSE REDUCTION: This exam was performed according to the departmental dose-optimization program which includes automated exposure control, adjustment of the mA and/or kV according to patient size and/or use of iterative reconstruction technique. COMPARISON:  None Available. FINDINGS: CT HEAD FINDINGS Brain: No evidence of acute infarction, hemorrhage, hydrocephalus, or extra-axial fluid collection. 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 Mild for age cerebral atrophy. Vascular: Calcific intracranial atherosclerosis. Skull: No acute fracture. Sinuses/Orbits: Minimal paranasal sinus mucosal thickening. No acute orbital findings. Other: No acute orbital findings. CT CERVICAL SPINE FINDINGS Alignment: Reversal of the normal cervical lordosis. No substantial sagittal subluxation. Skull base and vertebrae: Vertebral body heights are maintained. No evidence of acute fracture. Soft tissues and spinal canal: No prevertebral fluid or swelling. No visible canal hematoma. Disc levels: Severe degenerative disc disease at C4-C5, C5-C6 and C6-C7 including marked disc height loss, sclerotic vertebral body changes and endplate spurring. Severe multilevel facet uncovertebral hypertrophy with varying degrees of neural foraminal stenosis. Upper chest: Visualized lung apices are clear. IMPRESSION: CT head: 1. No evidence of acute intracranial abnormality. 2. 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 CT  cervical spine: 1. No evidence of acute fracture or traumatic malalignment. 2. Severe multilevel degenerative change in the cervical spine, described above. Electronically Signed   By: Margaretha Sheffield M.D.   On: 01/16/2022 09:00    Procedures Procedures    Medications Ordered in ED Medications  ketorolac (TORADOL) 30 MG/ML injection 15 mg (15 mg Intravenous Given 01/16/22 1118)  diazepam (VALIUM) injection 2.5 mg (2.5 mg Intravenous Given 01/16/22 1121)  lactated ringers bolus 1,000 mL (1,000 mLs Intravenous New Bag/Given 01/16/22 1109)  gadobutrol (GADAVIST) 1 MMOL/ML injection 9 mL (9 mLs Intravenous Contrast Given 01/16/22 1049)    ED Course/ Medical Decision Making/ A&P Clinical Course as of 01/16/22 1213  Thu Jan 16, 2022  0910 CT reviewed and interpreted radiologist interpretation states no acute abnormality [DR]  0911 CBC reviewed interpreted and leukocytosis noted with increased neutrophils and slightly increased lymphocyte [DR]  5809 Basic metabolic panel(!) CBC reviewed and interpreted with mild hyponatremia sodium 134,  mild hyperglycemia glucose 138 and creatinine increased to 1.34 [DR]  1154 MRI head and neck reviewed and interpreted MRI of the neck with no acute findings in the region, no evidence of bone marrow edema or Katsman of facet joint edema or Chronic degenerative spondylosis potential for neural compression levels C2 and 3 and bilateral from C3 C3-4 through C6-C7 per radiologist interpretation [DR]    Clinical Course User Index [DR] Pattricia Boss, MD                           Medical Decision Making 76 year old male with chronic lymphocytic leukemia presents today complaining of atraumatic neck pain.  Patient with normal neurological exam.  Patient evaluated here with labs and imaging. Differential diagnosis includes but is not limited to malignant disease, trauma and cervical spine injury, disc disease, infection, muscular etiology and spasm. CT and MRI of head  and neck obtained without any evidence of acute abnormalities and no evidence of bone marrow edema or enhancement no masses are noted. MRI shows no evidence of malignant disease, traumatic injury, or cord injury. Patient is afebrile and doubt infectious etiology Symptoms have improved with Valium.  Patient is now moving his neck much more than he was able to earlier Labs noted to have elevated white blood cell count 18,002 significantly decreased from first prior and patient has been taking medications as prescribed Hemoglobin is stable at 11.7 Mild hyponatremia with sodium of 134 and mild crease in creatinine.  Discussed results with patient.  Plan to continue his skeletal muscle relaxants, he reports having about 15 left. We have discussed return precautions and need for follow-up and he voices understanding.  Amount and/or Complexity of Data Reviewed Labs:  Decision-making details documented in ED Course. Radiology: ordered.  Risk Prescription drug management.          Final Clinical Impression(s) / ED Diagnoses Final diagnoses:  Torticollis, acute  Muscle spasms of neck    Rx / DC Orders ED Discharge Orders     None         Pattricia Boss, MD 01/16/22 1214

## 2022-09-04 DIAGNOSIS — H2513 Age-related nuclear cataract, bilateral: Secondary | ICD-10-CM | POA: Diagnosis not present

## 2023-02-05 IMAGING — MR MR HEAD WO/W CM
9 of 22 series · 18 of 48 positions shown · IV contrast (gadavist)
Comparison: CT studies earlier same day
COMPARISON: CT studies earlier same day

Addendum:
CLINICAL DATA: Sudden onset of headache and neck pain yesterday

EXAM:
MRI HEAD WITHOUT AND WITH CONTRAST
TECHNIQUE: Multiplanar, multiecho pulse sequences of the brain and surrounding
structures were obtained without and with intravenous contrast.
CONTRAST:  9mL GADAVIST GADOBUTROL 1 MMOL/ML IV SOLN

[Series 2: DWI · axial · 3.0mm · 0.94mm/px · z∈[-62,+79]mm · 4 of 96 slices shown (1 of 2)]
[im 1/96]
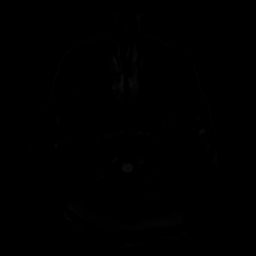
[im 32/96]
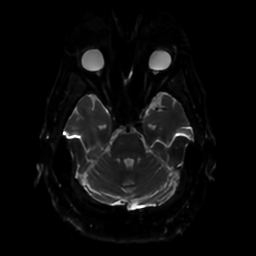
[im 64/96]
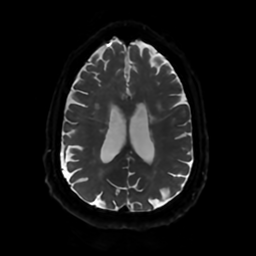
[im 96/96]
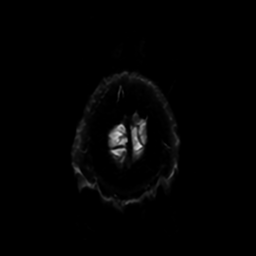

[Series 3: DWI · coronal · 4.0mm · 0.94mm/px · 3 of 71 slices shown (2 of 2)]
[im 1/71]
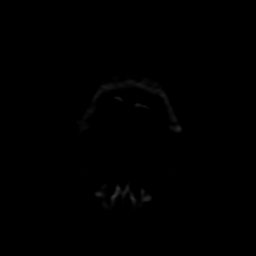
[im 36/71]
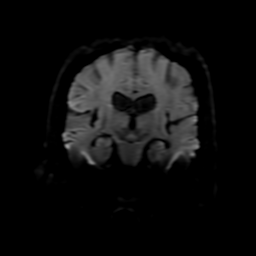
[im 71/71]
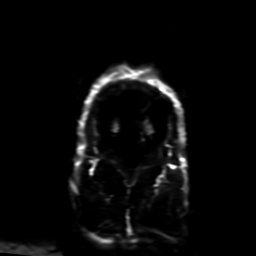

[Series 4: FLAIR · sagittal · 5.0mm · 0.23mm/px · 1 of 23 slices shown (1 of 3)]
[im 1/23]
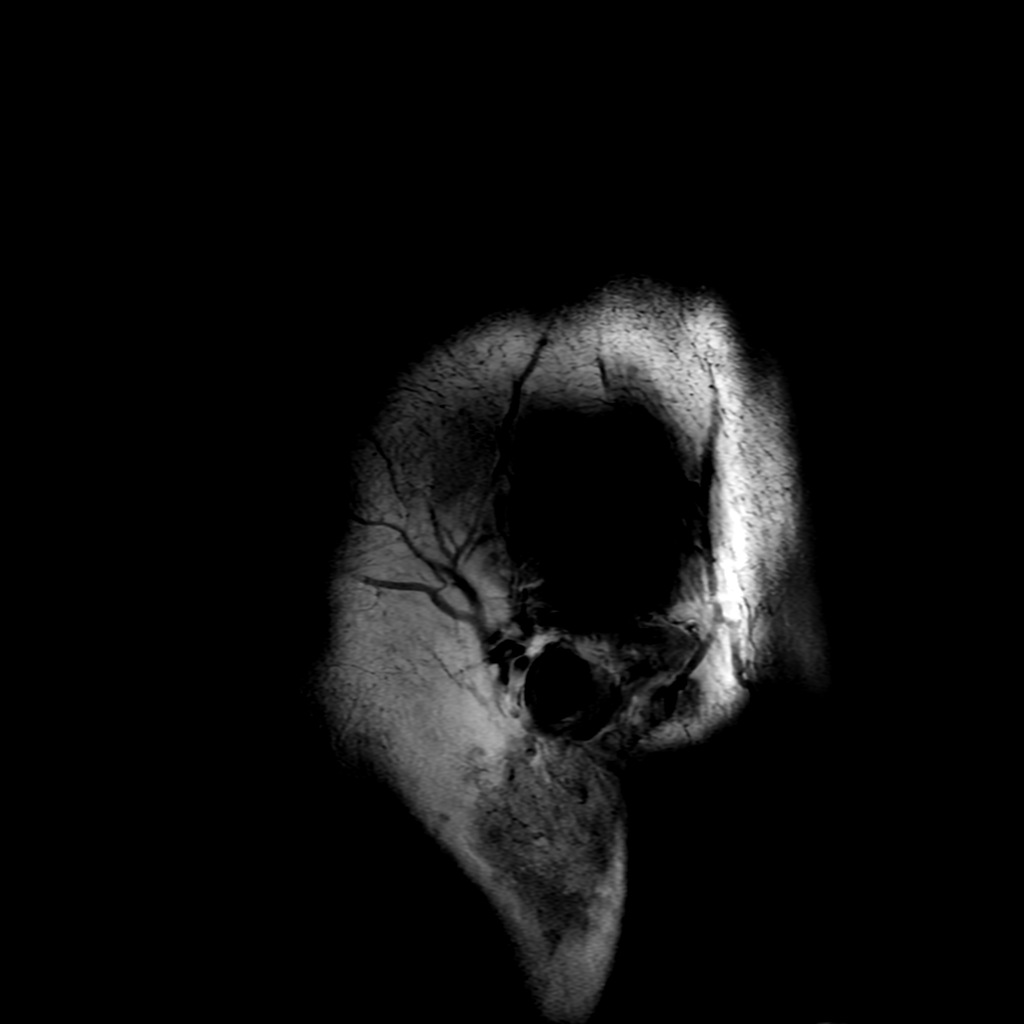

[Series 5: T2 · axial · 5.0mm · 0.23mm/px · 1 of 24 slices shown]
[im 1/24]
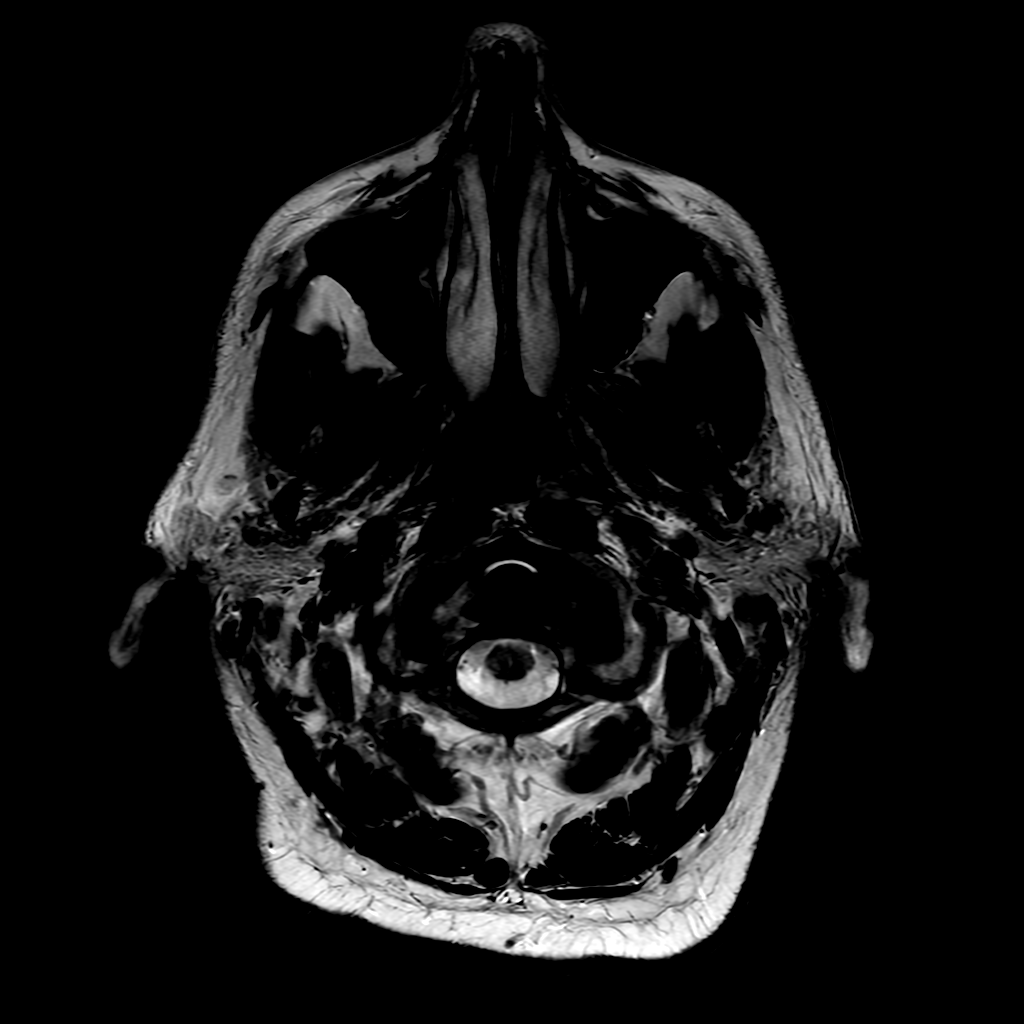

[Series 6: FLAIR · axial · 4.0mm · 0.45mm/px · z∈[-60,+80]mm · 2 of 33 slices shown (2 of 3)]
[im 1/33]
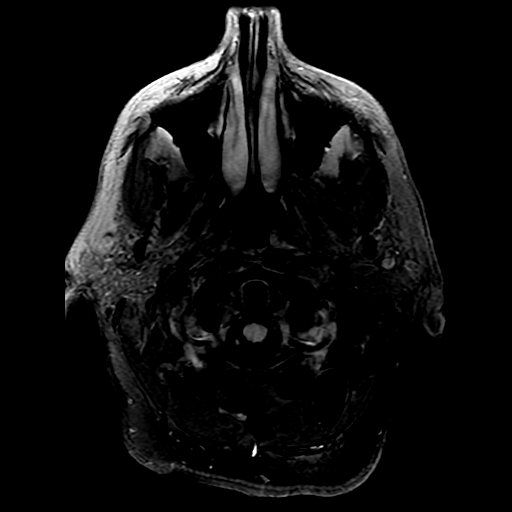
[im 33/33]
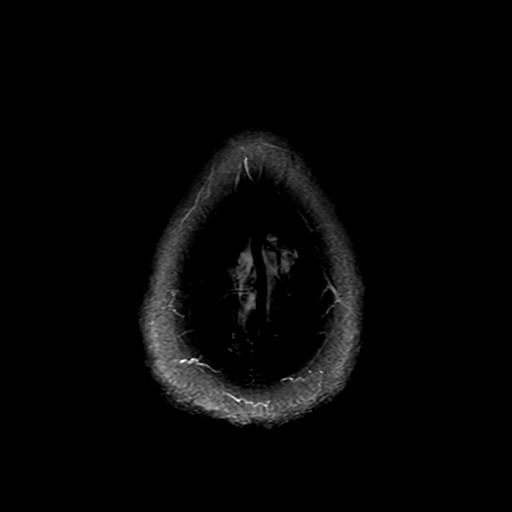

[Series 12: FLAIR · sagittal · 3.0mm · 0.43mm/px · 1 of 16 slices shown (3 of 3)]
[im 1/16]
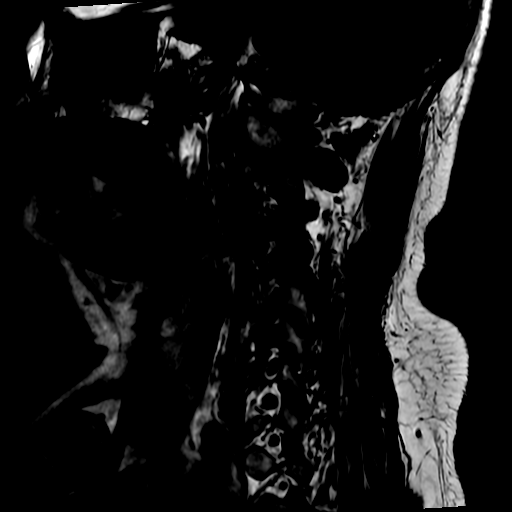

[Series 19: T1 post-contrast · axial · 3.0mm · 0.35mm/px · z∈[-200,-101]mm · 2 of 31 slices shown]
[im 1/31]
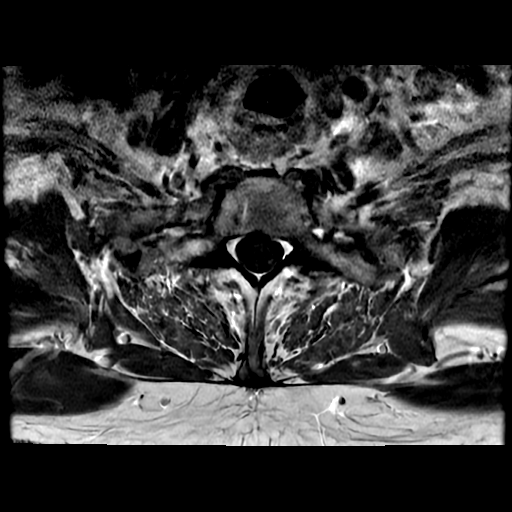
[im 31/31]
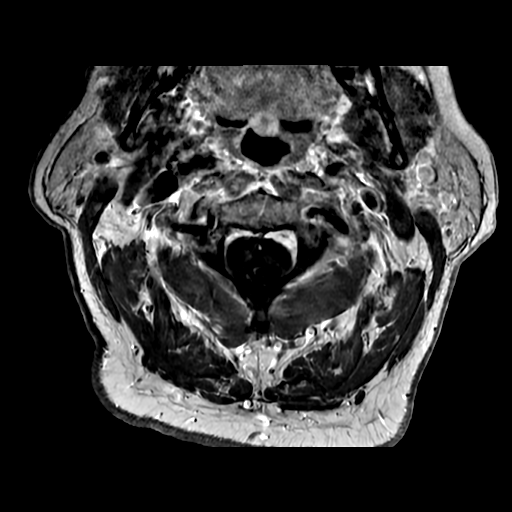

[Series 250: ADC · axial · 3.0mm · 0.94mm/px · z∈[-62,+79]mm · 2 of 48 slices shown (1 of 2)]
[im 1/48]
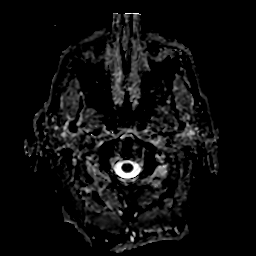
[im 48/48]
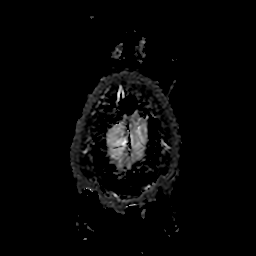

[Series 350: ADC · coronal · 4.0mm · 0.94mm/px · 2 of 36 slices shown (2 of 2)]
[im 1/36]
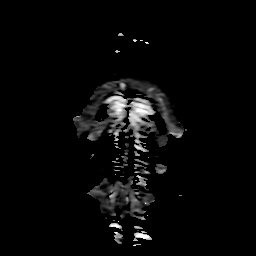
[im 36/36]
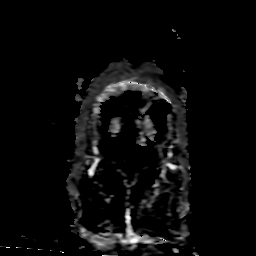

[18 of 48 positions shown; findings below may reference images not displayed]

FINDINGS: Brain: No acute or subacute infarction. Small foci of T2 shine
through in the left parietal cortical and subcortical brain. There
is a small chronic cavernoma in the right pons without evidence of
active hemorrhage or surrounding edema. No focal cerebellar finding.
Cerebral hemispheres show chronic small-vessel ischemic changes of
the deep and subcortical white matter and an old infarction at the
left parietal cortical and subcortical brain. No mass lesion,
hemorrhage, hydrocephalus or extra-axial collection. After contrast
administration, no abnormal enhancement occurs.

Vascular: Major vessels at the base of the brain show flow.

Skull and upper cervical spine: Negative

Sinuses/Orbits: Clear/normal

Other: None
IMPRESSION: No abnormality seen to explain the clinical presentation.

Chronic small-vessel ischemic changes of the cerebral hemispheric
white matter. Old small cortical and subcortical infarction in the
left parietal lobe.

Incidental subcentimeter cavernoma in the right pons without
evidence recent hemorrhage or surrounding edema.

ADDENDUM:
No enhancement associated with the right parietal vertex convexity
calcification. This is not significant.

*** End of Addendum ***
FINDINGS: Brain: No acute or subacute infarction. Small foci of T2 shine
through in the left parietal cortical and subcortical brain. There
is a small chronic cavernoma in the right pons without evidence of
active hemorrhage or surrounding edema. No focal cerebellar finding.
Cerebral hemispheres show chronic small-vessel ischemic changes of
the deep and subcortical white matter and an old infarction at the
left parietal cortical and subcortical brain. No mass lesion,
hemorrhage, hydrocephalus or extra-axial collection. After contrast
administration, no abnormal enhancement occurs.

Vascular: Major vessels at the base of the brain show flow.

Skull and upper cervical spine: Negative

Sinuses/Orbits: Clear/normal

Other: None
IMPRESSION: No abnormality seen to explain the clinical presentation.

Chronic small-vessel ischemic changes of the cerebral hemispheric
white matter. Old small cortical and subcortical infarction in the
left parietal lobe.

Incidental subcentimeter cavernoma in the right pons without
evidence recent hemorrhage or surrounding edema.

## 2023-07-06 DIAGNOSIS — E785 Hyperlipidemia, unspecified: Secondary | ICD-10-CM | POA: Diagnosis not present

## 2023-07-06 DIAGNOSIS — F419 Anxiety disorder, unspecified: Secondary | ICD-10-CM | POA: Diagnosis not present

## 2023-07-06 DIAGNOSIS — F325 Major depressive disorder, single episode, in full remission: Secondary | ICD-10-CM | POA: Diagnosis not present

## 2023-07-06 DIAGNOSIS — E669 Obesity, unspecified: Secondary | ICD-10-CM | POA: Diagnosis not present

## 2023-07-06 DIAGNOSIS — I10 Essential (primary) hypertension: Secondary | ICD-10-CM | POA: Diagnosis not present

## 2023-07-06 DIAGNOSIS — M1990 Unspecified osteoarthritis, unspecified site: Secondary | ICD-10-CM | POA: Diagnosis not present

## 2023-07-06 DIAGNOSIS — J309 Allergic rhinitis, unspecified: Secondary | ICD-10-CM | POA: Diagnosis not present

## 2023-07-06 DIAGNOSIS — N400 Enlarged prostate without lower urinary tract symptoms: Secondary | ICD-10-CM | POA: Diagnosis not present

## 2023-07-06 DIAGNOSIS — I2510 Atherosclerotic heart disease of native coronary artery without angina pectoris: Secondary | ICD-10-CM | POA: Diagnosis not present

## 2024-03-17 ENCOUNTER — Other Ambulatory Visit: Payer: Self-pay

## 2024-03-17 ENCOUNTER — Emergency Department (HOSPITAL_COMMUNITY)
Admission: EM | Admit: 2024-03-17 | Discharge: 2024-03-18 | Disposition: A | Discharge status: Home / Self Care | Attending: Emergency Medicine | Admitting: Emergency Medicine

## 2024-03-17 ENCOUNTER — Emergency Department (HOSPITAL_COMMUNITY)

## 2024-03-17 ENCOUNTER — Encounter (HOSPITAL_COMMUNITY): Payer: Self-pay | Admitting: Emergency Medicine

## 2024-03-17 DIAGNOSIS — M25532 Pain in left wrist: Secondary | ICD-10-CM | POA: Insufficient documentation

## 2024-03-17 DIAGNOSIS — I2510 Atherosclerotic heart disease of native coronary artery without angina pectoris: Secondary | ICD-10-CM | POA: Diagnosis not present

## 2024-03-17 DIAGNOSIS — Z856 Personal history of leukemia: Secondary | ICD-10-CM | POA: Insufficient documentation

## 2024-03-17 HISTORY — DX: Unspecified osteoarthritis, unspecified site: M19.90

## 2024-03-17 NOTE — ED Triage Notes (Signed)
 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

## 2024-03-18 MED ORDER — IBUPROFEN 400 MG PO TABS
600.0000 mg | ORAL_TABLET | Freq: Once | ORAL | Status: AC
  Administered 2024-03-18: 600 mg via ORAL
  Filled 2024-03-18: qty 1

## 2024-03-18 MED ORDER — DICLOFENAC SODIUM 1 % EX GEL: 2.0000 g | Freq: Four times a day (QID) | CUTANEOUS | 0 refills | Status: AC
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
## 2024-06-14 DIAGNOSIS — Z7982 Long term (current) use of aspirin: Secondary | ICD-10-CM | POA: Diagnosis not present

## 2024-06-14 DIAGNOSIS — F1021 Alcohol dependence, in remission: Secondary | ICD-10-CM | POA: Diagnosis not present

## 2024-06-14 DIAGNOSIS — I2510 Atherosclerotic heart disease of native coronary artery without angina pectoris: Secondary | ICD-10-CM | POA: Diagnosis not present

## 2024-06-14 DIAGNOSIS — F419 Anxiety disorder, unspecified: Secondary | ICD-10-CM | POA: Diagnosis not present

## 2024-06-14 DIAGNOSIS — M1990 Unspecified osteoarthritis, unspecified site: Secondary | ICD-10-CM | POA: Diagnosis not present

## 2024-06-14 DIAGNOSIS — I509 Heart failure, unspecified: Secondary | ICD-10-CM | POA: Diagnosis not present

## 2024-06-14 DIAGNOSIS — E669 Obesity, unspecified: Secondary | ICD-10-CM | POA: Diagnosis not present

## 2024-06-14 DIAGNOSIS — E785 Hyperlipidemia, unspecified: Secondary | ICD-10-CM | POA: Diagnosis not present

## 2024-06-14 DIAGNOSIS — I110 Hypertensive heart disease with heart failure: Secondary | ICD-10-CM | POA: Diagnosis not present
